# Patient Record
Sex: Female | Born: 1970 | Race: White | Hispanic: No | Marital: Single | Smoking: Never smoker
Health system: Southern US, Community
[De-identification: ages and names within clinical notes are randomized; demographics above are authoritative.]

## PROBLEM LIST (undated history)

## (undated) DIAGNOSIS — Z9889 Other specified postprocedural states: Secondary | ICD-10-CM

## (undated) DIAGNOSIS — R112 Nausea with vomiting, unspecified: Secondary | ICD-10-CM

## (undated) DIAGNOSIS — T8859XA Other complications of anesthesia, initial encounter: Secondary | ICD-10-CM

## (undated) DIAGNOSIS — F419 Anxiety disorder, unspecified: Secondary | ICD-10-CM

## (undated) DIAGNOSIS — T4145XA Adverse effect of unspecified anesthetic, initial encounter: Secondary | ICD-10-CM

## (undated) DIAGNOSIS — D259 Leiomyoma of uterus, unspecified: Secondary | ICD-10-CM

## (undated) HISTORY — PX: ABDOMINAL HYSTERECTOMY: SHX81

## (undated) HISTORY — PX: IUD REMOVAL: SHX5392

---
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
## 2005-08-17 ENCOUNTER — Observation Stay: Payer: Self-pay | Admitting: Unknown Physician Specialty

## 2005-08-18 ENCOUNTER — Observation Stay: Payer: Self-pay

## 2005-09-30 ENCOUNTER — Observation Stay: Payer: Self-pay | Admitting: Obstetrics & Gynecology

## 2005-10-29 ENCOUNTER — Inpatient Hospital Stay: Payer: Self-pay | Admitting: Unknown Physician Specialty

## 2006-02-13 ENCOUNTER — Emergency Department: Payer: Self-pay | Admitting: Emergency Medicine

## 2007-07-03 ENCOUNTER — Ambulatory Visit: Payer: Self-pay | Admitting: Obstetrics & Gynecology

## 2007-12-13 ENCOUNTER — Ambulatory Visit: Payer: Self-pay | Admitting: Obstetrics & Gynecology

## 2013-04-11 ENCOUNTER — Emergency Department: Payer: Self-pay | Admitting: Emergency Medicine

## 2013-04-11 LAB — CBC
HCT: 40.4 % (ref 35.0–47.0)
HGB: 13.8 g/dL (ref 12.0–16.0)
MCHC: 34.2 g/dL (ref 32.0–36.0)
RBC: 4.58 10*6/uL (ref 3.80–5.20)
RDW: 13.3 % (ref 11.5–14.5)

## 2013-04-11 LAB — BASIC METABOLIC PANEL
BUN: 12 mg/dL (ref 7–18)
Chloride: 109 mmol/L — ABNORMAL HIGH (ref 98–107)
Co2: 21 mmol/L (ref 21–32)
Creatinine: 0.85 mg/dL (ref 0.60–1.30)
Glucose: 91 mg/dL (ref 65–99)

## 2013-04-11 LAB — TROPONIN I: Troponin-I: <0.02 ng/mL

## 2016-05-12 ENCOUNTER — Emergency Department
Admission: EM | Admit: 2016-05-12 | Discharge: 2016-05-12 | Disposition: A | Discharge status: Home / Self Care | Payer: Managed Care, Other (non HMO) | Attending: Emergency Medicine | Admitting: Emergency Medicine

## 2016-05-12 ENCOUNTER — Emergency Department: Payer: Managed Care, Other (non HMO)

## 2016-05-12 DIAGNOSIS — Y999 Unspecified external cause status: Secondary | ICD-10-CM | POA: Diagnosis not present

## 2016-05-12 DIAGNOSIS — S39012A Strain of muscle, fascia and tendon of lower back, initial encounter: Secondary | ICD-10-CM | POA: Insufficient documentation

## 2016-05-12 DIAGNOSIS — Y92410 Unspecified street and highway as the place of occurrence of the external cause: Secondary | ICD-10-CM | POA: Diagnosis not present

## 2016-05-12 DIAGNOSIS — S6992XA Unspecified injury of left wrist, hand and finger(s), initial encounter: Secondary | ICD-10-CM | POA: Diagnosis present

## 2016-05-12 DIAGNOSIS — S39011A Strain of muscle, fascia and tendon of abdomen, initial encounter: Secondary | ICD-10-CM | POA: Insufficient documentation

## 2016-05-12 DIAGNOSIS — S62325A Displaced fracture of shaft of fourth metacarpal bone, left hand, initial encounter for closed fracture: Secondary | ICD-10-CM | POA: Insufficient documentation

## 2016-05-12 DIAGNOSIS — S20212A Contusion of left front wall of thorax, initial encounter: Secondary | ICD-10-CM | POA: Insufficient documentation

## 2016-05-12 DIAGNOSIS — S62309A Unspecified fracture of unspecified metacarpal bone, initial encounter for closed fracture: Secondary | ICD-10-CM

## 2016-05-12 DIAGNOSIS — Y9389 Activity, other specified: Secondary | ICD-10-CM | POA: Insufficient documentation

## 2016-05-12 LAB — URINALYSIS COMPLETE WITH MICROSCOPIC (ARMC ONLY)
BILIRUBIN URINE: NEGATIVE
GLUCOSE, UA: NEGATIVE mg/dL
KETONES UR: NEGATIVE mg/dL
LEUKOCYTES UA: NEGATIVE
NITRITE: NEGATIVE
Protein, ur: NEGATIVE mg/dL
SPECIFIC GRAVITY, URINE: 1.013 (ref 1.005–1.030)
pH: 5 (ref 5.0–8.0)

## 2016-05-12 MED ORDER — HYDROCODONE-ACETAMINOPHEN 5-325 MG PO TABS: 1.0000 | ORAL_TABLET | ORAL | Status: DC | PRN

## 2016-05-12 MED ORDER — BACLOFEN 10 MG PO TABS: 10.0000 mg | ORAL_TABLET | Freq: Three times a day (TID) | ORAL | Status: DC

## 2016-05-12 MED ORDER — IBUPROFEN 600 MG PO TABS: 600.0000 mg | ORAL_TABLET | Freq: Four times a day (QID) | ORAL | Status: DC | PRN
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
## 2016-12-23 ENCOUNTER — Other Ambulatory Visit: Payer: Self-pay | Admitting: Family Medicine

## 2016-12-23 DIAGNOSIS — Z1231 Encounter for screening mammogram for malignant neoplasm of breast: Secondary | ICD-10-CM

## 2017-01-04 ENCOUNTER — Ambulatory Visit
Admission: RE | Admit: 2017-01-04 | Discharge: 2017-01-04 | Disposition: A | Discharge status: Home / Self Care | Payer: Managed Care, Other (non HMO) | Source: Ambulatory Visit | Attending: Family Medicine | Admitting: Family Medicine

## 2017-01-04 DIAGNOSIS — N6489 Other specified disorders of breast: Secondary | ICD-10-CM | POA: Insufficient documentation

## 2017-01-04 DIAGNOSIS — Z1231 Encounter for screening mammogram for malignant neoplasm of breast: Secondary | ICD-10-CM

## 2017-01-10 ENCOUNTER — Other Ambulatory Visit: Payer: Self-pay | Admitting: *Deleted

## 2017-01-10 ENCOUNTER — Inpatient Hospital Stay
Admission: RE | Admit: 2017-01-10 | Discharge: 2017-01-10 | Disposition: A | Discharge status: Home / Self Care | Payer: Self-pay | Source: Ambulatory Visit | Attending: *Deleted | Admitting: *Deleted

## 2017-01-10 DIAGNOSIS — Z9289 Personal history of other medical treatment: Secondary | ICD-10-CM

## 2017-01-16 ENCOUNTER — Other Ambulatory Visit: Payer: Self-pay | Admitting: Family Medicine

## 2017-01-16 DIAGNOSIS — N6489 Other specified disorders of breast: Secondary | ICD-10-CM

## 2017-01-16 DIAGNOSIS — R928 Other abnormal and inconclusive findings on diagnostic imaging of breast: Secondary | ICD-10-CM

## 2017-01-27 ENCOUNTER — Other Ambulatory Visit: Payer: Managed Care, Other (non HMO)

## 2017-01-27 ENCOUNTER — Ambulatory Visit: Payer: Managed Care, Other (non HMO) | Attending: Family Medicine

## 2017-03-07 ENCOUNTER — Ambulatory Visit
Admission: RE | Admit: 2017-03-07 | Discharge: 2017-03-07 | Disposition: A | Discharge status: Home / Self Care | Payer: Self-pay | Source: Ambulatory Visit | Attending: Family Medicine | Admitting: Family Medicine

## 2017-03-07 DIAGNOSIS — R928 Other abnormal and inconclusive findings on diagnostic imaging of breast: Secondary | ICD-10-CM

## 2017-03-07 DIAGNOSIS — N6312 Unspecified lump in the right breast, upper inner quadrant: Secondary | ICD-10-CM | POA: Insufficient documentation

## 2017-03-07 DIAGNOSIS — N6489 Other specified disorders of breast: Secondary | ICD-10-CM

## 2017-03-09 ENCOUNTER — Other Ambulatory Visit: Payer: Self-pay | Admitting: Family Medicine

## 2017-03-09 DIAGNOSIS — R928 Other abnormal and inconclusive findings on diagnostic imaging of breast: Secondary | ICD-10-CM

## 2017-03-09 DIAGNOSIS — N6310 Unspecified lump in the right breast, unspecified quadrant: Secondary | ICD-10-CM

## 2017-03-17 ENCOUNTER — Ambulatory Visit
Admission: RE | Admit: 2017-03-17 | Discharge: 2017-03-17 | Disposition: A | Discharge status: Home / Self Care | Payer: Medicaid Other | Source: Ambulatory Visit | Attending: Family Medicine | Admitting: Family Medicine

## 2017-03-17 DIAGNOSIS — R928 Other abnormal and inconclusive findings on diagnostic imaging of breast: Secondary | ICD-10-CM

## 2017-03-17 DIAGNOSIS — N6312 Unspecified lump in the right breast, upper inner quadrant: Secondary | ICD-10-CM | POA: Insufficient documentation

## 2017-03-17 DIAGNOSIS — N6310 Unspecified lump in the right breast, unspecified quadrant: Secondary | ICD-10-CM

## 2017-03-17 HISTORY — PX: BREAST BIOPSY: SHX20

## 2017-03-20 LAB — SURGICAL PATHOLOGY

## 2017-12-27 ENCOUNTER — Other Ambulatory Visit: Payer: Self-pay | Admitting: Family Medicine

## 2017-12-27 DIAGNOSIS — Z1231 Encounter for screening mammogram for malignant neoplasm of breast: Secondary | ICD-10-CM

## 2018-01-12 ENCOUNTER — Ambulatory Visit
Admission: RE | Admit: 2018-01-12 | Discharge: 2018-01-12 | Disposition: A | Discharge status: Home / Self Care | Payer: Managed Care, Other (non HMO) | Source: Ambulatory Visit | Attending: Family Medicine | Admitting: Family Medicine

## 2018-01-12 DIAGNOSIS — Z1231 Encounter for screening mammogram for malignant neoplasm of breast: Secondary | ICD-10-CM | POA: Diagnosis not present

## 2019-01-18 ENCOUNTER — Other Ambulatory Visit: Payer: Self-pay

## 2019-01-18 ENCOUNTER — Encounter: Payer: Self-pay | Admitting: Emergency Medicine

## 2019-01-18 ENCOUNTER — Ambulatory Visit
Admission: EM | Admit: 2019-01-18 | Discharge: 2019-01-18 | Disposition: A | Discharge status: Home / Self Care | Payer: Managed Care, Other (non HMO) | Attending: Family Medicine | Admitting: Family Medicine

## 2019-01-18 DIAGNOSIS — J069 Acute upper respiratory infection, unspecified: Secondary | ICD-10-CM | POA: Diagnosis not present

## 2019-01-18 DIAGNOSIS — B9789 Other viral agents as the cause of diseases classified elsewhere: Secondary | ICD-10-CM

## 2019-01-18 LAB — RAPID INFLUENZA A&B ANTIGENS (ARMC ONLY): INFLUENZA A (ARMC): NEGATIVE

## 2019-01-18 LAB — RAPID INFLUENZA A&B ANTIGENS: Influenza B (ARMC): NEGATIVE

## 2019-01-18 LAB — RAPID STREP SCREEN (MED CTR MEBANE ONLY): Streptococcus, Group A Screen (Direct): NEGATIVE

## 2019-01-18 MED ORDER — HYDROCOD POLST-CPM POLST ER 10-8 MG/5ML PO SUER: 5.0000 mL | Freq: Two times a day (BID) | ORAL | 0 refills | Status: DC | PRN

## 2019-01-18 NOTE — ED Provider Notes (Signed)
[facility]    CSN: [id number] Arrival date & time: 01/18/19  1227     History   Chief Complaint Chief Complaint  Patient presents with  . Cough    HPI Amy Hubbard is a 48 y.o. female.   The history is provided by the patient.  Cough  Associated symptoms: ear pain, rhinorrhea and sore throat   Associated symptoms: no myalgias and no wheezing   URI  Presenting symptoms: congestion, cough, ear pain, rhinorrhea and sore throat   Severity:  Moderate Onset quality:  Sudden Duration:  2 days Timing:  Constant Progression:  Worsening Chronicity:  New Relieved by:  OTC medications Associated symptoms: no myalgias and no wheezing   Risk factors: sick contacts     History reviewed. No pertinent past medical history.  There are no active problems to display for this patient.   Past Surgical History:  Procedure Laterality Date  . BREAST BIOPSY Right 03/17/2017   fibroadenoma    OB History   No obstetric history on file.      Home Medications    Prior to Admission medications   Medication Sig Start Date End Date Taking? Authorizing Provider  ferrous sulfate 325 (65 FE) MG tablet Take 325 mg by mouth 2 (two) times daily with a meal.   Yes [provider]  FLUoxetine (PROZAC) 10 MG tablet Take by mouth. 12/03/18  Yes [provider]  ibuprofen (ADVIL,MOTRIN) 600 MG tablet Take 1 tablet (600 mg total) by mouth every 6 (six) hours as needed. 05/12/16  Yes Beers, Pierce Crane, PA-C  baclofen (LIORESAL) 10 MG tablet Take 1 tablet (10 mg total) by mouth 3 (three) times daily. 05/12/16   Beers, Pierce Crane, PA-C  chlorpheniramine-HYDROcodone (TUSSIONEX PENNKINETIC ER) 10-8 MG/5ML SUER Take 5 mLs by mouth every 12 (twelve) hours as needed. 01/18/19   Norval Gable, MD  HYDROcodone-acetaminophen (NORCO) 5-325 MG tablet Take 1-2 tablets by mouth every 4 (four) hours as needed for moderate pain. 05/12/16   Beers, Pierce Crane, PA-C    Family  History Family History  Problem Relation Age of Onset  . Breast cancer Neg Hx     Social History Social History   Tobacco Use  . Smoking status: Never Smoker  . Smokeless tobacco: Never Used  Substance Use Topics  . Alcohol use: No  . Drug use: Not on file     Allergies   Patient has no known allergies.   Review of Systems Review of Systems  HENT: Positive for congestion, ear pain, rhinorrhea and sore throat.   Respiratory: Positive for cough. Negative for wheezing.   Musculoskeletal: Negative for myalgias.     Physical Exam Triage Vital Signs ED Triage Vitals  Enc Vitals Group     BP 01/18/19 1253 128/79     Pulse Rate 01/18/19 1253 75     Resp 01/18/19 1253 16     Temp 01/18/19 1253 99 F (37.2 C)     Temp Source 01/18/19 1253 Oral     SpO2 01/18/19 1253 100 %     Weight 01/18/19 1245 184 lb (83.5 kg)     Height 01/18/19 1245 5\' 2"  (1.575 m)     Head Circumference --      Peak Flow --      Pain Score 01/18/19 1245 4     Pain Loc --      Pain Edu? --      Excl. in [place]? --  No data found.  Updated Vital Signs BP 128/79 (BP Location: Left Arm)   Pulse 75   Temp 99 F (37.2 C) (Oral)   Resp 16   Ht 5\' 2"  (1.575 m)   Wt 83.5 kg   LMP 01/01/2019 (Exact Date)   SpO2 100%   BMI 33.65 kg/m   Visual Acuity Right Eye Distance:   Left Eye Distance:   Bilateral Distance:    Right Eye Near:   Left Eye Near:    Bilateral Near:     Physical Exam Vitals signs and nursing note reviewed.  Constitutional:      General: She is not in acute distress.    Appearance: She is well-developed. She is not toxic-appearing or diaphoretic.  HENT:     Head: Normocephalic and atraumatic.     Right Ear: Tympanic membrane, ear canal and external ear normal.     Left Ear: Tympanic membrane, ear canal and external ear normal.     Nose: Congestion and rhinorrhea present.     Mouth/Throat:     Pharynx: Uvula midline. No oropharyngeal exudate.  Neck:      Musculoskeletal: Normal range of motion and neck supple.     Thyroid: No thyromegaly.  Cardiovascular:     Rate and Rhythm: Normal rate and regular rhythm.     Heart sounds: Normal heart sounds.  Pulmonary:     Effort: Pulmonary effort is normal. No respiratory distress.     Breath sounds: Normal breath sounds. No stridor. No wheezing, rhonchi or rales.  Lymphadenopathy:     Cervical: No cervical adenopathy.  Neurological:     Mental Status: She is alert.      UC Treatments / Results  Labs (all labs ordered are listed, but only abnormal results are displayed) Labs Reviewed  RAPID INFLUENZA A&B ANTIGENS ([facility] ONLY)  RAPID STREP SCREEN ([facility] ONLY)  CULTURE, GROUP A STREP [facility])    EKG None  Radiology No results found.  Procedures Procedures (including critical care time)  Medications Ordered in UC Medications - No data to display  Initial Impression / Assessment and Plan / UC Course  I have reviewed the triage vital signs and the nursing notes.  Pertinent labs & imaging results that were available during my care of the patient were reviewed by me and considered in my medical decision making (see chart for details).      Final Clinical Impressions(s) / UC Diagnoses   Final diagnoses:  Viral URI with cough    ED Prescriptions    Medication Sig Dispense Auth. Provider   chlorpheniramine-HYDROcodone (TUSSIONEX PENNKINETIC ER) 10-8 MG/5ML SUER Take 5 mLs by mouth every 12 (twelve) hours as needed. 60 mL Norval Gable, MD     1. diagnosis reviewed with patient 2. rx as per orders above; reviewed possible side effects, interactions, risks and benefits  3. Recommend supportive treatment with  4. Follow-up prn if symptoms worsen or don't improve Controlled Substance Prescriptions [place] Controlled Substance Registry consulted? Not Applicable   Norval Gable, MD 01/18/19 1352

## 2019-01-18 NOTE — ED Triage Notes (Signed)
Patient c/o cough, chest congestion bilateral ear pain and sore throat that started 2 days ago. Patient denies fevers.

## 2019-01-21 LAB — CULTURE, GROUP A STREP (THRC)

## 2019-01-30 ENCOUNTER — Inpatient Hospital Stay: Admission: RE | Admit: 2019-01-30 | Payer: Managed Care, Other (non HMO) | Source: Ambulatory Visit

## 2019-02-08 ENCOUNTER — Ambulatory Visit: Admit: 2019-02-08 | Payer: Managed Care, Other (non HMO) | Admitting: Obstetrics and Gynecology

## 2019-02-08 SURGERY — HYSTERECTOMY, VAGINAL, LAPAROSCOPY-ASSISTED, WITH SALPINGECTOMY
Anesthesia: General | Laterality: Bilateral

## 2019-04-19 ENCOUNTER — Ambulatory Visit: Admit: 2019-04-19 | Payer: Managed Care, Other (non HMO) | Admitting: Obstetrics and Gynecology

## 2019-04-19 SURGERY — HYSTERECTOMY, VAGINAL, LAPAROSCOPY-ASSISTED, WITH SALPINGECTOMY
Anesthesia: General | Laterality: Bilateral

## 2019-05-09 NOTE — H&P (Signed)
Amy Hubbard is a 48 y.o. female here for Rf Dr Santiago Glad Behling-menorrhagia .pt here for menorrhagia for > 1 yr . Bleeds for 5-6 days and passes clots . Menses are monthly . + dysmenorrhea , not sexually active . G2P2 svdx1 , c/s x1 . Not sexually active and + dyspareunia in past   Pap jan 2020- ASCUS r/o HGSIL  Colposcopic eval 12/2018 negative with neg ECC  EMBX : 1/ 2020 - neg   SIS : showed   Endometrial polyp seen with saline= 1.47 x 0.91 x 1.48 cm  Fibroid ut seen  1 lt lat= 17 mm 2 rt mid= 23 mm  Endometrium=14.92 mm  bil ovs wnl Past Medical History:  has a past medical history of Iron deficiency anemia due to chronic blood loss (10/17/2018), Migraines, and Vitamin D deficiency.  Past Surgical History:  has a past surgical history that includes esure and Cesarean section. Family History: family history includes Alcohol abuse in her mother; Anuerysm in her father; Bone cancer in her brother; Coronary Artery Disease (Blocked arteries around heart) in her paternal grandfather; Coronary Artery Disease (Blocked arteries around heart) (age of onset: 60) in her mother; Depression in her mother; High blood pressure (Hypertension) in her father; No Known Problems in her brother and daughter. Social History:  reports that she has never smoked. She has never used smokeless tobacco. She reports previous alcohol use. She reports that she does not use drugs. OB/GYN History:          OB History    Gravida  2   Para  2   Term      Preterm      AB      Living  2     SAB      TAB      Ectopic      Molar      Multiple      Live Births  2          Allergies: has No Known Allergies. Medications:  Current Outpatient Medications:  .  ibuprofen (MOTRIN) 400 MG tablet, Take 400 mg by mouth every 6 (six) hours as needed for Pain, Disp: , Rfl:  .  FLUoxetine (PROZAC) 10 MG tablet, Take 10 mg by mouth once daily, Disp: , Rfl:   Review of Systems: General:                       No fatigue or weight loss Eyes:                           No vision changes Ears:                            No hearing difficulty Respiratory:                No cough or shortness of breath Pulmonary:                  No asthma or shortness of breath Cardiovascular:           No chest pain, palpitations, dyspnea on exertion Gastrointestinal:          No abdominal bloating, chronic diarrhea, constipations, masses, pain or hematochezia Genitourinary:             No hematuria, dysuria, abnormal vaginal discharge, pelvic pain,+ Menometrorrhagia Lymphatic:  No swollen lymph nodes Musculoskeletal:         No muscle weakness Neurologic:                  No extremity weakness, syncope, seizure disorder Psychiatric:                  No history of depression, delusions or suicidal/homicidal ideation    Exam:      Vitals:   05/08/2019  0945  BP: 133/84  Pulse: 74    Body mass index is 34.2 kg/m.  WDWN white/  female in NAD   Lungs: CTA  CV : RRR without murmur   Neck:  no thyromegaly Abdomen: soft , no mass, normal active bowel sounds,  non-tender, no rebound tenderness Pelvic: tanner stage 5 ,  External genitalia: vulva /labia no lesions Urethra: no prolapse Vagina: normal physiologic d/c, limited room for TVH if need be  Cervix: no lesions, no cervical motion tenderness   Uterus: normal size shape and contour, non-tender Adnexa: no mass,  non-tender   Rectovaginal:   Impression:   The primary encounter diagnosis was Menorrhagia with irregular cycle. Pt elects for definitive surgery LAVH and bilateral salpingectomy    Plan:  Proceed with surgery as above   She has been counseled regarding the risks of the procedure . All questions have been answered

## 2019-05-24 ENCOUNTER — Encounter
Admission: RE | Admit: 2019-05-24 | Discharge: 2019-05-24 | Disposition: A | Discharge status: Home / Self Care | Payer: Managed Care, Other (non HMO) | Source: Ambulatory Visit | Attending: Obstetrics and Gynecology | Admitting: Obstetrics and Gynecology

## 2019-05-24 ENCOUNTER — Encounter: Payer: Self-pay | Admitting: *Deleted

## 2019-05-24 ENCOUNTER — Other Ambulatory Visit: Payer: Self-pay

## 2019-05-24 NOTE — Patient Instructions (Addendum)
Your procedure is scheduled on: 05/31/2019 Fri Report to Same Day Surgery 2nd floor medical mall [facility] Entrance-take elevator on left to 2nd floor.  Check in with surgery information desk.) To find out your arrival time please call [phone] between 1PM - 3PM on 05/30/2019 Thur  Remember: Instructions that are not followed completely may result in serious medical risk, up to and including death, or upon the discretion of your surgeon and anesthesiologist your surgery may need to be rescheduled.    _x___ 1. Do not eat food after midnight the night before your procedure. You may drink clear liquids up to 2 hours before you are scheduled to arrive at the hospital for your procedure.  Do not drink clear liquids within 2 hours of your scheduled arrival to the hospital.  Clear liquids include  --Water or Apple juice without pulp  --Clear carbohydrate beverage such as ClearFast or Gatorade  --Black Coffee or Clear Tea (No milk, no creamers, do not add anything to                  the coffee or Tea Type 1 and type 2 diabetics should only drink water.   ____Ensure clear carbohydrate drink on the way to the hospital for bariatric patients  ____Ensure clear carbohydrate drink 3 hours before surgery for Dr Dwyane Luo patients if physician instructed.   No gum chewing or hard candies.     __x__ 2. No Alcohol for 24 hours before or after surgery.   __x__3. No Smoking or e-cigarettes for 24 prior to surgery.  Do not use any chewable tobacco products for at least 6 hour prior to surgery   ____  4. Bring all medications with you on the day of surgery if instructed.    __x__ 5. Notify your doctor if there is any change in your medical condition     (cold, fever, infections).    x___6. On the morning of surgery brush your teeth with toothpaste and water.  You may rinse your mouth with mouth wash if you wish.  Do not swallow any toothpaste or mouthwash.   Do not wear jewelry, make-up,  hairpins, clips or nail polish.  Do not wear lotions, powders, or perfumes. You may wear deodorant.  Do not shave 48 hours prior to surgery. Men may shave face and neck.  Do not bring valuables to the hospital.    [facility] is not responsible for any belongings or valuables.               Contacts, dentures or bridgework may not be worn into surgery.  Leave your suitcase in the car. After surgery it may be brought to your room.  For patients admitted to the hospital, discharge time is determined by your                       treatment team.  _  Patients discharged the day of surgery will not be allowed to drive home.  You will need someone to drive you home and stay with you the night of your procedure.    Please read over the following fact sheets that you were given:   [facility] Preparing for Surgery and or MRSA Information   _x___ Take anti-hypertensive listed below, cardiac, seizure, asthma,     anti-reflux and psychiatric medicines. These include:  1. none  2.  3.  4.  5.  6.  ____Fleets enema or Magnesium Citrate as directed.  _x___ Use CHG Soap or sage wipes as directed on instruction sheet   ____ Use inhalers on the day of surgery and bring to hospital day of surgery  ____ Stop Metformin and Janumet 2 days prior to surgery.    ____ Take 1/2 of usual insulin dose the night before surgery and none on the morning     surgery.   _x___ Follow recommendations from Cardiologist, Pulmonologist or PCP regarding          stopping Aspirin, Coumadin, Plavix ,Eliquis, Effient, or Pradaxa, and Pletal.  X____Stop Anti-inflammatories such as Advil, Aleve, Ibuprofen, Motrin, Naproxen, Naprosyn, Goodies powders or aspirin products. OK to take Tylenol and                          Celebrex.   _x___ Stop supplements until after surgery.  But may continue Vitamin D, Vitamin B,       and multivitamin.   ____ Bring C-Pap to the hospital.

## 2019-05-24 NOTE — Pre-Procedure Instructions (Signed)
Incentive spirometry and carb drink to be given day of Covid testing along with instructions.

## 2019-05-28 ENCOUNTER — Other Ambulatory Visit
Admission: RE | Admit: 2019-05-28 | Discharge: 2019-05-28 | Disposition: A | Discharge status: Home / Self Care | Payer: Managed Care, Other (non HMO) | Source: Ambulatory Visit | Attending: Obstetrics and Gynecology | Admitting: Obstetrics and Gynecology

## 2019-05-28 ENCOUNTER — Other Ambulatory Visit: Payer: Self-pay

## 2019-05-28 DIAGNOSIS — Z01812 Encounter for preprocedural laboratory examination: Secondary | ICD-10-CM | POA: Diagnosis not present

## 2019-05-28 DIAGNOSIS — Z1159 Encounter for screening for other viral diseases: Secondary | ICD-10-CM | POA: Insufficient documentation

## 2019-05-28 DIAGNOSIS — N920 Excessive and frequent menstruation with regular cycle: Secondary | ICD-10-CM | POA: Insufficient documentation

## 2019-05-28 LAB — CBC
HCT: 40 % (ref 36.0–46.0)
Hemoglobin: 12.7 g/dL (ref 12.0–15.0)
MCH: 28.4 pg (ref 26.0–34.0)
MCHC: 31.8 g/dL (ref 30.0–36.0)
MCV: 89.5 fL (ref 80.0–100.0)
Platelets: 342 10*3/uL (ref 150–400)
RBC: 4.47 MIL/uL (ref 3.87–5.11)
RDW: 12.9 % (ref 11.5–15.5)
WBC: 8 10*3/uL (ref 4.0–10.5)
nRBC: 0 % (ref 0.0–0.2)

## 2019-05-28 LAB — TYPE AND SCREEN
ABO/RH(D): O NEG
Antibody Screen: NEGATIVE

## 2019-05-28 LAB — BASIC METABOLIC PANEL
Anion gap: 10 (ref 5–15)
BUN: 16 mg/dL (ref 6–20)
CO2: 22 mmol/L (ref 22–32)
Calcium: 9 mg/dL (ref 8.9–10.3)
Chloride: 105 mmol/L (ref 98–111)
Creatinine, Ser: 0.7 mg/dL (ref 0.44–1.00)
GFR calc Af Amer: >60 mL/min (ref >60)
GFR calc non Af Amer: >60 mL/min (ref >60)
Glucose, Bld: 90 mg/dL (ref 70–99)
Potassium: 4.2 mmol/L (ref 3.5–5.1)
Sodium: 137 mmol/L (ref 135–145)

## 2019-05-28 LAB — SARS CORONAVIRUS 2 (TAT 6-24 HRS): SARS Coronavirus 2: NEGATIVE

## 2019-05-31 ENCOUNTER — Observation Stay: Payer: Managed Care, Other (non HMO) | Admitting: Anesthesiology

## 2019-05-31 ENCOUNTER — Observation Stay
Admission: RE | Admit: 2019-05-31 | Discharge: 2019-06-01 | Disposition: A | Discharge status: Home / Self Care | Payer: Managed Care, Other (non HMO) | Attending: Obstetrics and Gynecology | Admitting: Obstetrics and Gynecology

## 2019-05-31 ENCOUNTER — Encounter: Payer: Self-pay | Admitting: *Deleted

## 2019-05-31 ENCOUNTER — Encounter
Admission: RE | Disposition: A | Discharge status: Home / Self Care | Payer: Self-pay | Source: Home / Self Care | Attending: Obstetrics and Gynecology

## 2019-05-31 ENCOUNTER — Other Ambulatory Visit: Payer: Self-pay

## 2019-05-31 DIAGNOSIS — Z6833 Body mass index (BMI) 33.0-33.9, adult: Secondary | ICD-10-CM | POA: Insufficient documentation

## 2019-05-31 DIAGNOSIS — N841 Polyp of cervix uteri: Secondary | ICD-10-CM | POA: Insufficient documentation

## 2019-05-31 DIAGNOSIS — Z791 Long term (current) use of non-steroidal anti-inflammatories (NSAID): Secondary | ICD-10-CM | POA: Insufficient documentation

## 2019-05-31 DIAGNOSIS — N920 Excessive and frequent menstruation with regular cycle: Secondary | ICD-10-CM | POA: Diagnosis not present

## 2019-05-31 DIAGNOSIS — Z811 Family history of alcohol abuse and dependence: Secondary | ICD-10-CM | POA: Insufficient documentation

## 2019-05-31 DIAGNOSIS — N840 Polyp of corpus uteri: Secondary | ICD-10-CM | POA: Insufficient documentation

## 2019-05-31 DIAGNOSIS — N941 Unspecified dyspareunia: Secondary | ICD-10-CM | POA: Diagnosis not present

## 2019-05-31 DIAGNOSIS — E559 Vitamin D deficiency, unspecified: Secondary | ICD-10-CM | POA: Insufficient documentation

## 2019-05-31 DIAGNOSIS — D509 Iron deficiency anemia, unspecified: Secondary | ICD-10-CM | POA: Insufficient documentation

## 2019-05-31 DIAGNOSIS — Z808 Family history of malignant neoplasm of other organs or systems: Secondary | ICD-10-CM | POA: Diagnosis not present

## 2019-05-31 DIAGNOSIS — N838 Other noninflammatory disorders of ovary, fallopian tube and broad ligament: Secondary | ICD-10-CM | POA: Diagnosis not present

## 2019-05-31 DIAGNOSIS — Z818 Family history of other mental and behavioral disorders: Secondary | ICD-10-CM | POA: Diagnosis not present

## 2019-05-31 DIAGNOSIS — Z79899 Other long term (current) drug therapy: Secondary | ICD-10-CM | POA: Insufficient documentation

## 2019-05-31 DIAGNOSIS — D251 Intramural leiomyoma of uterus: Secondary | ICD-10-CM | POA: Diagnosis not present

## 2019-05-31 DIAGNOSIS — D250 Submucous leiomyoma of uterus: Secondary | ICD-10-CM | POA: Insufficient documentation

## 2019-05-31 DIAGNOSIS — Z8249 Family history of ischemic heart disease and other diseases of the circulatory system: Secondary | ICD-10-CM | POA: Diagnosis not present

## 2019-05-31 DIAGNOSIS — N800 Endometriosis of uterus: Secondary | ICD-10-CM | POA: Diagnosis not present

## 2019-05-31 DIAGNOSIS — Z9889 Other specified postprocedural states: Secondary | ICD-10-CM

## 2019-05-31 DIAGNOSIS — E669 Obesity, unspecified: Secondary | ICD-10-CM | POA: Diagnosis not present

## 2019-05-31 HISTORY — DX: Leiomyoma of uterus, unspecified: D25.9

## 2019-05-31 HISTORY — DX: Nausea with vomiting, unspecified: Z98.890

## 2019-05-31 HISTORY — DX: Other complications of anesthesia, initial encounter: T88.59XA

## 2019-05-31 HISTORY — DX: Anxiety disorder, unspecified: F41.9

## 2019-05-31 HISTORY — PX: LAPAROSCOPIC VAGINAL HYSTERECTOMY WITH SALPINGECTOMY: SHX6680

## 2019-05-31 LAB — POCT PREGNANCY, URINE: Preg Test, Ur: NEGATIVE

## 2019-05-31 LAB — ABO/RH: ABO/RH(D): O NEG

## 2019-05-31 SURGERY — HYSTERECTOMY, VAGINAL, LAPAROSCOPY-ASSISTED, WITH SALPINGECTOMY
Anesthesia: General | Laterality: Bilateral

## 2019-05-31 MED ORDER — BUPIVACAINE HCL (PF) 0.5 % IJ SOLN
INTRAMUSCULAR | Status: AC
  Filled 2019-05-31: qty 30

## 2019-05-31 MED ORDER — ACETAMINOPHEN 500 MG PO TABS
1000.0000 mg | ORAL_TABLET | ORAL | Status: AC
Start: 2019-05-31 — End: 2019-05-31
  Administered 2019-05-31: 07:00:00 1000 mg via ORAL

## 2019-05-31 MED ORDER — SUGAMMADEX SODIUM 200 MG/2ML IV SOLN
INTRAVENOUS | Status: DC | PRN
  Administered 2019-05-31: 200 mg via INTRAVENOUS

## 2019-05-31 MED ORDER — LIDOCAINE-EPINEPHRINE 1 %-1:100000 IJ SOLN
INTRAMUSCULAR | Status: DC | PRN
  Administered 2019-05-31: 9 mL

## 2019-05-31 MED ORDER — PROPOFOL 500 MG/50ML IV EMUL
INTRAVENOUS | Status: DC | PRN
  Administered 2019-05-31: 150 ug/kg/min via INTRAVENOUS

## 2019-05-31 MED ORDER — PROPOFOL 500 MG/50ML IV EMUL
INTRAVENOUS | Status: AC
  Filled 2019-05-31: qty 50

## 2019-05-31 MED ORDER — KETOROLAC TROMETHAMINE 30 MG/ML IJ SOLN
INTRAMUSCULAR | Status: DC | PRN
  Administered 2019-05-31: 30 mg via INTRAVENOUS

## 2019-05-31 MED ORDER — FENTANYL CITRATE (PF) 100 MCG/2ML IJ SOLN
INTRAMUSCULAR | Status: AC
  Filled 2019-05-31: qty 2

## 2019-05-31 MED ORDER — SCOPOLAMINE 1 MG/3DAYS TD PT72
1.0000 | MEDICATED_PATCH | TRANSDERMAL | Status: DC
  Administered 2019-05-31: 07:00:00 1.5 mg via TRANSDERMAL

## 2019-05-31 MED ORDER — LIDOCAINE-EPINEPHRINE 1 %-1:100000 IJ SOLN
INTRAMUSCULAR | Status: AC
  Filled 2019-05-31: qty 1

## 2019-05-31 MED ORDER — FENTANYL CITRATE (PF) 100 MCG/2ML IJ SOLN
25.0000 ug | INTRAMUSCULAR | Status: DC | PRN
  Administered 2019-05-31 (×2): 50 ug via INTRAVENOUS
  Administered 2019-05-31: 11:00:00 25 ug via INTRAVENOUS

## 2019-05-31 MED ORDER — LACTATED RINGERS IV SOLN
INTRAVENOUS | Status: DC | PRN
  Administered 2019-05-31: 08:00:00 via INTRAVENOUS

## 2019-05-31 MED ORDER — ROCURONIUM BROMIDE 50 MG/5ML IV SOLN
INTRAVENOUS | Status: AC
  Filled 2019-05-31: qty 1

## 2019-05-31 MED ORDER — GABAPENTIN 300 MG PO CAPS
300.0000 mg | ORAL_CAPSULE | Freq: Every day | ORAL | Status: DC
  Administered 2019-05-31: 23:00:00 300 mg via ORAL
  Filled 2019-05-31: qty 1

## 2019-05-31 MED ORDER — FLUOXETINE HCL 10 MG PO CAPS
10.0000 mg | ORAL_CAPSULE | Freq: Every day | ORAL | Status: DC
  Administered 2019-05-31: 10 mg via ORAL
  Filled 2019-05-31: qty 1

## 2019-05-31 MED ORDER — ACETAMINOPHEN 500 MG PO TABS
1000.0000 mg | ORAL_TABLET | Freq: Four times a day (QID) | ORAL | Status: DC
  Administered 2019-05-31 – 2019-06-01 (×4): 1000 mg via ORAL
  Filled 2019-05-31 (×4): qty 2

## 2019-05-31 MED ORDER — DEXAMETHASONE SODIUM PHOSPHATE 10 MG/ML IJ SOLN
INTRAMUSCULAR | Status: DC | PRN
  Administered 2019-05-31: 10 mg via INTRAVENOUS

## 2019-05-31 MED ORDER — GABAPENTIN 300 MG PO CAPS
ORAL_CAPSULE | ORAL | Status: AC
  Administered 2019-05-31: 300 mg via ORAL
  Filled 2019-05-31: qty 1

## 2019-05-31 MED ORDER — FENTANYL CITRATE (PF) 250 MCG/5ML IJ SOLN
INTRAMUSCULAR | Status: AC
  Filled 2019-05-31: qty 5

## 2019-05-31 MED ORDER — SUCCINYLCHOLINE CHLORIDE 20 MG/ML IJ SOLN
INTRAMUSCULAR | Status: AC
  Filled 2019-05-31: qty 1

## 2019-05-31 MED ORDER — ONDANSETRON HCL 4 MG/2ML IJ SOLN
INTRAMUSCULAR | Status: AC
  Filled 2019-05-31: qty 2

## 2019-05-31 MED ORDER — ONDANSETRON HCL 4 MG/2ML IJ SOLN
4.0000 mg | Freq: Four times a day (QID) | INTRAMUSCULAR | Status: DC | PRN
  Administered 2019-05-31: 18:00:00 4 mg via INTRAVENOUS
  Filled 2019-05-31: qty 2

## 2019-05-31 MED ORDER — OXYCODONE HCL 5 MG PO TABS: 5.0000 mg | ORAL_TABLET | Freq: Once | ORAL | Status: DC | PRN

## 2019-05-31 MED ORDER — LACTATED RINGERS IV SOLN
INTRAVENOUS | Status: DC
  Administered 2019-05-31 (×3): via INTRAVENOUS

## 2019-05-31 MED ORDER — MIDAZOLAM HCL 2 MG/2ML IJ SOLN
INTRAMUSCULAR | Status: DC | PRN
  Administered 2019-05-31: 2 mg via INTRAVENOUS

## 2019-05-31 MED ORDER — LACTATED RINGERS IV SOLN: INTRAVENOUS | Status: DC

## 2019-05-31 MED ORDER — LACTATED RINGERS IV BOLUS
500.0000 mL | Freq: Once | INTRAVENOUS | Status: AC
  Administered 2019-05-31: 19:00:00 500 mL via INTRAVENOUS

## 2019-05-31 MED ORDER — ONDANSETRON HCL 4 MG PO TABS: 4.0000 mg | ORAL_TABLET | Freq: Four times a day (QID) | ORAL | Status: DC | PRN

## 2019-05-31 MED ORDER — OXYCODONE HCL 5 MG/5ML PO SOLN: 5.0000 mg | Freq: Once | ORAL | Status: DC | PRN

## 2019-05-31 MED ORDER — MEPERIDINE HCL 50 MG/ML IJ SOLN
6.2500 mg | INTRAMUSCULAR | Status: DC | PRN
  Administered 2019-05-31: 12.5 mg via INTRAVENOUS

## 2019-05-31 MED ORDER — PHENYLEPHRINE HCL (PRESSORS) 10 MG/ML IV SOLN
INTRAVENOUS | Status: AC
  Filled 2019-05-31: qty 1

## 2019-05-31 MED ORDER — PROPOFOL 10 MG/ML IV BOLUS
INTRAVENOUS | Status: DC | PRN
  Administered 2019-05-31: 160 mg via INTRAVENOUS

## 2019-05-31 MED ORDER — MEPERIDINE HCL 50 MG/ML IJ SOLN
INTRAMUSCULAR | Status: AC
  Filled 2019-05-31: qty 1

## 2019-05-31 MED ORDER — ROCURONIUM BROMIDE 100 MG/10ML IV SOLN
INTRAVENOUS | Status: DC | PRN
  Administered 2019-05-31: 10 mg via INTRAVENOUS
  Administered 2019-05-31: 50 mg via INTRAVENOUS

## 2019-05-31 MED ORDER — PROMETHAZINE HCL 25 MG/ML IJ SOLN: 6.2500 mg | INTRAMUSCULAR | Status: DC | PRN

## 2019-05-31 MED ORDER — GABAPENTIN 300 MG PO CAPS
300.0000 mg | ORAL_CAPSULE | ORAL | Status: AC
  Administered 2019-05-31: 07:00:00 300 mg via ORAL

## 2019-05-31 MED ORDER — PROPOFOL 10 MG/ML IV BOLUS
INTRAVENOUS | Status: AC
  Filled 2019-05-31: qty 20

## 2019-05-31 MED ORDER — SEVOFLURANE IN SOLN
RESPIRATORY_TRACT | Status: AC
  Filled 2019-05-31: qty 250

## 2019-05-31 MED ORDER — OXYCODONE HCL 5 MG PO TABS
5.0000 mg | ORAL_TABLET | ORAL | Status: DC | PRN
  Administered 2019-05-31: 10 mg via ORAL
  Filled 2019-05-31: qty 2

## 2019-05-31 MED ORDER — LIDOCAINE HCL (CARDIAC) PF 100 MG/5ML IV SOSY
PREFILLED_SYRINGE | INTRAVENOUS | Status: DC | PRN
  Administered 2019-05-31: 100 mg via INTRAVENOUS

## 2019-05-31 MED ORDER — FAMOTIDINE 20 MG PO TABS
ORAL_TABLET | ORAL | Status: AC
  Filled 2019-05-31: qty 1

## 2019-05-31 MED ORDER — MORPHINE SULFATE (PF) 2 MG/ML IV SOLN: 1.0000 mg | INTRAVENOUS | Status: DC | PRN

## 2019-05-31 MED ORDER — BUPIVACAINE HCL 0.5 % IJ SOLN
INTRAMUSCULAR | Status: DC | PRN
  Administered 2019-05-31: 17 mL

## 2019-05-31 MED ORDER — CEFAZOLIN SODIUM-DEXTROSE 2-4 GM/100ML-% IV SOLN
INTRAVENOUS | Status: AC
  Filled 2019-05-31: qty 100

## 2019-05-31 MED ORDER — FENTANYL CITRATE (PF) 100 MCG/2ML IJ SOLN
INTRAMUSCULAR | Status: DC | PRN
  Administered 2019-05-31: 50 ug via INTRAVENOUS

## 2019-05-31 MED ORDER — ONDANSETRON HCL 4 MG/2ML IJ SOLN
INTRAMUSCULAR | Status: DC | PRN
  Administered 2019-05-31: 4 mg via INTRAVENOUS

## 2019-05-31 MED ORDER — CEFAZOLIN SODIUM-DEXTROSE 2-4 GM/100ML-% IV SOLN: 2.0000 g | Freq: Once | INTRAVENOUS | Status: DC

## 2019-05-31 MED ORDER — DEXMEDETOMIDINE HCL IN NACL 80 MCG/20ML IV SOLN
INTRAVENOUS | Status: AC
  Filled 2019-05-31: qty 20

## 2019-05-31 MED ORDER — SCOPOLAMINE 1 MG/3DAYS TD PT72
MEDICATED_PATCH | TRANSDERMAL | Status: AC
  Administered 2019-05-31: 07:00:00 1.5 mg via TRANSDERMAL
  Filled 2019-05-31: qty 1

## 2019-05-31 MED ORDER — SOD CITRATE-CITRIC ACID 500-334 MG/5ML PO SOLN
30.0000 mL | ORAL | Status: AC
  Administered 2019-05-31: 07:00:00 30 mL via ORAL
  Filled 2019-05-31 (×2): qty 30

## 2019-05-31 MED ORDER — ACETAMINOPHEN 500 MG PO TABS
ORAL_TABLET | ORAL | Status: AC
  Administered 2019-05-31: 07:00:00 1000 mg via ORAL
  Filled 2019-05-31: qty 2

## 2019-05-31 MED ORDER — MIDAZOLAM HCL 2 MG/2ML IJ SOLN
INTRAMUSCULAR | Status: AC
  Filled 2019-05-31: qty 2

## 2019-05-31 SURGICAL SUPPLY — 49 items
BAG URINE DRAINAGE (UROLOGICAL SUPPLIES) ×2 IMPLANT
BLADE SURG SZ11 CARB STEEL (BLADE) ×2 IMPLANT
CATH FOLEY 2WAY  5CC 16FR (CATHETERS) ×1
CATH ROBINSON RED A/P 16FR (CATHETERS) ×2 IMPLANT
CATH URTH 16FR FL 2W BLN LF (CATHETERS) ×1 IMPLANT
CHLORAPREP W/TINT 26 (MISCELLANEOUS) ×2 IMPLANT
COVER WAND RF STERILE (DRAPES) ×2 IMPLANT
DERMABOND ADVANCED (GAUZE/BANDAGES/DRESSINGS) ×1
DERMABOND ADVANCED .7 DNX12 (GAUZE/BANDAGES/DRESSINGS) ×1 IMPLANT
DRAPE SURG 17X11 SM STRL (DRAPES) ×2 IMPLANT
DRSG TEGADERM 2-3/8X2-3/4 SM (GAUZE/BANDAGES/DRESSINGS) ×8 IMPLANT
ELECT REM PT RETURN 9FT ADLT (ELECTROSURGICAL) ×2
ELECTRODE REM PT RTRN 9FT ADLT (ELECTROSURGICAL) ×1 IMPLANT
FILTER LAP SMOKE EVAC STRL (MISCELLANEOUS) ×2 IMPLANT
GAUZE 4X4 16PLY RFD (DISPOSABLE) ×2 IMPLANT
GLOVE BIO SURGEON STRL SZ8 (GLOVE) ×14 IMPLANT
GOWN STRL REUS W/ TWL LRG LVL3 (GOWN DISPOSABLE) ×3 IMPLANT
GOWN STRL REUS W/ TWL XL LVL3 (GOWN DISPOSABLE) ×3 IMPLANT
GOWN STRL REUS W/TWL LRG LVL3 (GOWN DISPOSABLE) ×3
GOWN STRL REUS W/TWL XL LVL3 (GOWN DISPOSABLE) ×3
GRASPER SUT TROCAR 14GX15 (MISCELLANEOUS) ×2 IMPLANT
HANDLE YANKAUER SUCT BULB TIP (MISCELLANEOUS) ×2 IMPLANT
IRRIGATION STRYKERFLOW (MISCELLANEOUS) ×1 IMPLANT
IRRIGATOR STRYKERFLOW (MISCELLANEOUS) ×2
IV LACTATED RINGERS 1000ML (IV SOLUTION) ×2 IMPLANT
KIT PINK PAD W/HEAD ARE REST (MISCELLANEOUS) ×2
KIT PINK PAD W/HEAD ARM REST (MISCELLANEOUS) ×1 IMPLANT
KIT TURNOVER CYSTO (KITS) ×2 IMPLANT
LABEL OR SOLS (LABEL) ×2 IMPLANT
NEEDLE HYPO 22GX1.5 SAFETY (NEEDLE) ×2 IMPLANT
PACK BASIN MINOR ARMC (MISCELLANEOUS) ×2 IMPLANT
PACK GYN LAPAROSCOPIC (MISCELLANEOUS) ×2 IMPLANT
PAD OB MATERNITY 4.3X12.25 (PERSONAL CARE ITEMS) ×2 IMPLANT
SCISSORS METZENBAUM CVD 33 (INSTRUMENTS) ×1 IMPLANT
SHEARS HARMONIC ACE PLUS 36CM (ENDOMECHANICALS) ×2 IMPLANT
SLEEVE ENDOPATH XCEL 5M (ENDOMECHANICALS) ×4 IMPLANT
SPONGE GAUZE 2X2 8PLY STRL LF (GAUZE/BANDAGES/DRESSINGS) ×6 IMPLANT
STRIP CLOSURE SKIN 1/2X4 (GAUZE/BANDAGES/DRESSINGS) ×2 IMPLANT
SUT VIC AB 0 CT1 27 (SUTURE) ×4
SUT VIC AB 0 CT1 27XCR 8 STRN (SUTURE) ×2 IMPLANT
SUT VIC AB 0 CT1 36 (SUTURE) ×3 IMPLANT
SUT VIC AB 0 CT2 27 (SUTURE) ×3 IMPLANT
SUT VIC AB 2-0 UR6 27 (SUTURE) ×2 IMPLANT
SUT VIC AB 4-0 SH 27 (SUTURE) ×1
SUT VIC AB 4-0 SH 27XANBCTRL (SUTURE) ×1 IMPLANT
SYR 10ML LL (SYRINGE) ×2 IMPLANT
SYR CONTROL 10ML (SYRINGE) ×2 IMPLANT
TROCAR XCEL NON-BLD 5MMX100MML (ENDOMECHANICALS) ×2 IMPLANT
TUBING EVAC SMOKE HEATED PNEUM (TUBING) ×2 IMPLANT

## 2019-05-31 NOTE — Anesthesia Procedure Notes (Signed)
Procedure Name: Intubation Date/Time: 05/31/2019 7:45 AM Performed by: Justus Memory, CRNA Pre-anesthesia Checklist: Patient identified, Patient being monitored, Timeout performed, Emergency Drugs available and Suction available Patient Re-evaluated:Patient Re-evaluated prior to induction Oxygen Delivery Method: Circle system utilized Preoxygenation: Pre-oxygenation with 100% oxygen Induction Type: IV induction Ventilation: Mask ventilation without difficulty Laryngoscope Size: Mac and 3 Grade View: Grade I Tube type: Oral Tube size: 7.0 mm Number of attempts: 1 Airway Equipment and Method: Stylet and Bite block Placement Confirmation: ETT inserted through vocal cords under direct vision,  positive ETCO2 and breath sounds checked- equal and bilateral Secured at: 21 cm Tube secured with: Tape Dental Injury: Teeth and Oropharynx as per pre-operative assessment

## 2019-05-31 NOTE — Transfer of Care (Signed)
Immediate Anesthesia Transfer of Care Note  Patient: Amy Hubbard  Procedure(s) Performed: LAPAROSCOPIC ASSISTED VAGINAL HYSTERECTOMY WITH SALPINGECTOMY (Bilateral )  Patient Location: PACU  Anesthesia Type:General  Level of Consciousness: sedated  Airway & Oxygen Therapy: Patient Spontanous Breathing and Patient connected to face mask oxygen  Post-op Assessment: Report given to RN and Post -op Vital signs reviewed and stable  Post vital signs: Reviewed and stable  Last Vitals:  Vitals Value Taken Time  BP 110/58 05/31/19 1039  Temp    Pulse 66 05/31/19 1043  Resp 17 05/31/19 1043  SpO2 100 % 05/31/19 1043  Vitals shown include unvalidated device data.  Last Pain:  Vitals:   05/31/19 1030  TempSrc:   PainSc: (P) 0-No pain         Complications: No apparent anesthesia complications

## 2019-05-31 NOTE — Progress Notes (Signed)
LAVH and bilateral salpingectomy .  No questions  Labs reviewed. Covid neg  proceed

## 2019-05-31 NOTE — Op Note (Signed)
NAME: Amy Hubbard, Amy Hubbard MEDICAL RECORD [id number] ACCOUNT [id number] DATE OF BIRTH:[birth year] FACILITY: [facility] LOCATION: [facility] PHYSICIAN:Oksana Deberry Josefine Class, MD  OPERATIVE REPORT  DATE OF PROCEDURE:  05/31/2019  PREOPERATIVE DIAGNOSES: 1.  Menorrhagia. 2.  Endometrial polyp. 3.  Dyspareunia.  POSTOPERATIVE DIAGNOSES: 1.  Menorrhagia. 2.  Endometrial polyp.  3.  Dyspareunia.  PROCEDURE: 1.  Laparoscopic assisted vaginal hysterectomy. 2.  Bilateral salpingectomy.  ANESTHESIA:  General endotracheal anesthesia.  SURGEON:  Laverta Baltimore, MD.  FIRST ASSISTANT:  Chelsea Ward, MD  INDICATIONS:  This is a 48 year old gravida 2, para 2 patient with a long history of menorrhagia and iron deficiency anemia.  The patient underwent a workup that showed endometrial polyp.  Based on the patient's desire to rectify the menorrhagia and  dyspareunia, she opted for definitive surgery, i.e., hysterectomy.  DESCRIPTION OF PROCEDURE:  After adequate general endotracheal anesthesia, the patient was placed in dorsal supine position with the legs in the [place] stirrups.  The patient's abdomen, perineum and vagina were prepped and draped in normal sterile  fashion.  Timeout was performed.  The patient did receive 2 grams IV Ancef prior to commencement of the case.  Attention was directed vaginally where a speculum was placed in the vagina and the anterior cervix was grasped with a single tooth tenaculum  and uterine sound was placed into the endometrial cavity and tethered together with the single tooth tenaculum.  This was used for uterine manipulation during the procedure.  Straight catheterization of the bladder yielded 50 mL clear urine.  Gloves were  changed.  Attention was directed to the patient's abdomen where a 5 mm infraumbilical incision was made after injecting with 0.5% Marcaine.  A 5 mm laparoscope was advanced into the abdominal cavity under direct visualization with the  Optiview cannula.   Second port placement was placed in the left lower quadrant 3 cm medial to the left anterior iliac spine and a third port site was placed in the right lower quadrant, again 3 cm medial to the right anterior iliac spine.  Initial impression revealed a  benign appearing left ovarian cyst.  There were some adhesions from the colon to the left cul-de-sac.  Attention was directed to the patient's left fallopian tube, which was grasped at the end and the Harmonic scalpel was used to dissect the fallopian  tube from the mesosalpinx and all the way to the cornua.  The left round ligament was opened followed by clamping and transecting the left ovarian uterine ligament.  The left uterine artery was skeletonized and cauterized with the Kleppinger and  transected with the Harmonic scalpel.  The anterior bladder flap was created with Harmonic scalpel.  Similar procedure was repeated on the patient's right side.  Again, the fallopian tube was dissected free from the ovary with the Harmonic scalpel and  the uteroovarian ligament was transected.  Broad ligament was dissected and the right uterine artery was skeletonized, cauterized and transected.  There were some adhesions noted of the bladder to the anterior cervix given her prior history of cesarean  section.  Attention was then directed vaginally and a weighted speculum was placed in the posterior vaginal vault.  The cervix was grasped with 2 thyroid tenacula followed by circumferential injection with 1% lidocaine with 1:100,000 epinephrine.  A  direct posterior colpotomy incision was made.  Upon entry into the posterior cul-de-sac, a long billed weighted speculum was placed.  The uterosacral ligaments were bilaterally clamped, transected, suture ligated with 0 Vicryl suture and tagged  for later  identification.  Anterior cervix was circumferentially incised with the Bovie, and the anterior cul-de-sac was entered and the bladder was reflected  anteriorly.  The cardinal ligaments were bilaterally clamped, transected, suture ligated with 0 Vicryl  suture and 2 additional clamps were used to connect the dissection from the laparoscopic approach.  The uterus was delivered.  The vaginal cuff was then closed with a running 0 Vicryl suture with good approximation of edges.  One additional  figure-of-eight suture was required at the apex due to some bleeding.  Gloves were changed.  Attention was redirected to the abdominal cavity and the patient's abdomen was insufflated, the cuff was irrigated and good hemostasis was noted.  There was  normal peristaltic activity from the ureters prior to dissection and after dissection.  Pressure was lowered to 7 mmHg and good hemostasis was noted.  Upper abdomen appeared normal.  There was a small adhesion from the colon to the left pelvic sidewall,  which was taken down with Harmonic scalpel.  The patient's abdomen was deflated and the incision sites were closed with 4-0 Vicryl suture.  Sterile dressing applied.  COMPLICATIONS:  There were no complications.  ESTIMATED BLOOD LOSS:  100 mL  URINE OUTPUT:  50 mL and an additional 50 mL with Foley placement at the end of the case.  INTRAOPERATIVE FLUIDS:  1000 mL  DISPOSITION:  The patient tolerated the procedure well and was taken to recovery room in good condition.  TN/NUANCE  D:05/31/2019 T:05/31/2019 JOB:[id number]

## 2019-05-31 NOTE — Brief Op Note (Signed)
05/31/2019  10:17 AM  PATIENT:  Amy Hubbard  48 y.o. female  PRE-OPERATIVE DIAGNOSIS:  Menorrhagia, dyspareunia , endometrial polyp  POST-OPERATIVE DIAGNOSIS:  Menorrhagia, dyspareunia  Endometrial polyp   PROCEDURE:  Procedure(s): LAPAROSCOPIC ASSISTED VAGINAL HYSTERECTOMY WITH SALPINGECTOMY (Bilateral)  SURGEON:  Surgeon(s) and Role:    * Schermerhorn, Gwen Her, MD - Primary    * Ward, Honor Loh, MD - Assisting  PHYSICIAN ASSISTANT:   ASSISTANTS: none   ANESTHESIA:   none  EBL:  100 mL   BLOOD ADMINISTERED:none  DRAINS: Urinary Catheter (Foley)   LOCAL MEDICATIONS USED:  MARCAINE    and LIDOCAINE   SPECIMEN:  Source of Specimen:  cx uterus and bilateral tubes   DISPOSITION OF SPECIMEN:  PATHOLOGY  COUNTS:  YES  TOURNIQUET:  * No tourniquets in log *  DICTATION: .Other Dictation: Dictation Number verbal  PLAN OF CARE: Admit for overnight observation  PATIENT DISPOSITION:  PACU - hemodynamically stable.   Delay start of Pharmacological VTE agent (>24hrs) due to surgical blood loss or risk of bleeding: not applicable

## 2019-05-31 NOTE — Anesthesia Post-op Follow-up Note (Signed)
Anesthesia QCDR form completed.        

## 2019-05-31 NOTE — Anesthesia Preprocedure Evaluation (Signed)
Anesthesia Evaluation  Patient identified by MRN, date of birth, ID band Patient awake    Reviewed: Allergy & Precautions, NPO status , Patient's Chart, lab work & pertinent test results  History of Anesthesia Complications (+) PONV and history of anesthetic complications  Airway Mallampati: II  TM Distance: >3 FB Neck ROM: Full    Dental no notable dental hx.    Pulmonary neg pulmonary ROS, neg sleep apnea, neg COPD,    breath sounds clear to auscultation- rhonchi (-) wheezing      Cardiovascular Exercise Tolerance: Good (-) hypertension(-) CAD, (-) Past MI, (-) Cardiac Stents and (-) CABG  Rhythm:Regular Rate:Normal - Systolic murmurs and - Diastolic murmurs    Neuro/Psych neg Seizures Anxiety negative neurological ROS     GI/Hepatic negative GI ROS, Neg liver ROS,   Endo/Other  negative endocrine ROSneg diabetes  Renal/GU negative Renal ROS     Musculoskeletal negative musculoskeletal ROS (+)   Abdominal (+) + obese,   Peds  Hematology negative hematology ROS (+)   Anesthesia Other Findings Past Medical History: No date: Anxiety No date: Complication of anesthesia No date: PONV (postoperative nausea and vomiting) No date: Uterine fibroid   Reproductive/Obstetrics                             Anesthesia Physical Anesthesia Plan  ASA: II  Anesthesia Plan: General   Post-op Pain Management:    Induction: Intravenous  PONV Risk Score and Plan: 3 and Ondansetron, Dexamethasone, TIVA, Scopolamine patch - Pre-op and Midazolam  Airway Management Planned: Oral ETT  Additional Equipment:   Intra-op Plan:   Post-operative Plan: Extubation in OR  Informed Consent: I have reviewed the patients History and Physical, chart, labs and discussed the procedure including the risks, benefits and alternatives for the proposed anesthesia with the patient or authorized representative who has  indicated his/her understanding and acceptance.     Dental advisory given  Plan Discussed with: CRNA and Anesthesiologist  Anesthesia Plan Comments:         Anesthesia Quick Evaluation

## 2019-05-31 NOTE — Progress Notes (Signed)
.   Patient ID: Amy Hubbard, female   DOB: 1971/11/13, 48 y.o.   MRN: [id number] DOS , LAVH bilateral salpingectomy  sore . Some nausea . Orine output 30-35 cc/ hr  VSS  Abd: soft appropriate TTP  A: stable  P; IVF bolus 500 cc D/c foley after  IVF at 100 cc/hr

## 2019-05-31 NOTE — Anesthesia Postprocedure Evaluation (Signed)
Anesthesia Post Note  Patient: Amy Hubbard  Procedure(s) Performed: LAPAROSCOPIC ASSISTED VAGINAL HYSTERECTOMY WITH SALPINGECTOMY (Bilateral )  Patient location during evaluation: PACU Anesthesia Type: General Level of consciousness: awake and alert and oriented Pain management: pain level controlled Vital Signs Assessment: post-procedure vital signs reviewed and stable Respiratory status: spontaneous breathing, nonlabored ventilation and respiratory function stable Cardiovascular status: blood pressure returned to baseline and stable Postop Assessment: no signs of nausea or vomiting Anesthetic complications: no     Last Vitals:  Vitals:   05/31/19 1315 05/31/19 1405  BP: 127/64 109/75  Pulse: 66 76  Resp: 16 16  Temp: 36.4 C 37 C  SpO2: 100% 98%    Last Pain:  Vitals:   05/31/19 1406  TempSrc:   PainSc: 4                  Shambhavi Salley

## 2019-06-01 DIAGNOSIS — N920 Excessive and frequent menstruation with regular cycle: Secondary | ICD-10-CM | POA: Diagnosis not present

## 2019-06-01 LAB — CBC
HCT: 32 % — ABNORMAL LOW (ref 36.0–46.0)
Hemoglobin: 10.2 g/dL — ABNORMAL LOW (ref 12.0–15.0)
MCH: 28.4 pg (ref 26.0–34.0)
MCHC: 31.9 g/dL (ref 30.0–36.0)
MCV: 89.1 fL (ref 80.0–100.0)
Platelets: 275 10*3/uL (ref 150–400)
RBC: 3.59 MIL/uL — ABNORMAL LOW (ref 3.87–5.11)
RDW: 12.9 % (ref 11.5–15.5)
WBC: 10.5 10*3/uL (ref 4.0–10.5)
nRBC: 0 % (ref 0.0–0.2)

## 2019-06-01 LAB — BASIC METABOLIC PANEL
Anion gap: 6 (ref 5–15)
BUN: 11 mg/dL (ref 6–20)
CO2: 24 mmol/L (ref 22–32)
Calcium: 8.3 mg/dL — ABNORMAL LOW (ref 8.9–10.3)
Chloride: 108 mmol/L (ref 98–111)
Creatinine, Ser: 0.68 mg/dL (ref 0.44–1.00)
GFR calc Af Amer: >60 mL/min (ref >60)
GFR calc non Af Amer: >60 mL/min (ref >60)
Glucose, Bld: 94 mg/dL (ref 70–99)
Potassium: 3.8 mmol/L (ref 3.5–5.1)
Sodium: 138 mmol/L (ref 135–145)

## 2019-06-01 MED ORDER — IBUPROFEN 800 MG PO TABS: 800.0000 mg | ORAL_TABLET | Freq: Three times a day (TID) | ORAL | 0 refills | Status: DC | PRN

## 2019-06-01 MED ORDER — GABAPENTIN 300 MG PO CAPS: 300.0000 mg | ORAL_CAPSULE | Freq: Every day | ORAL | 0 refills | Status: DC

## 2019-06-01 MED ORDER — ONDANSETRON HCL 4 MG PO TABS: 4.0000 mg | ORAL_TABLET | Freq: Four times a day (QID) | ORAL | 0 refills | Status: DC | PRN

## 2019-06-01 MED ORDER — OXYCODONE-ACETAMINOPHEN 5-325 MG PO TABS: 1.0000 | ORAL_TABLET | Freq: Four times a day (QID) | ORAL | 0 refills | Status: DC | PRN

## 2019-06-01 NOTE — Discharge Summary (Signed)
Physician Discharge Summary  Patient ID: Amy Hubbard MRN: [id number] DOB/AGE: 1971-07-11 48 y.o.  Admit date: 05/31/2019 Discharge date: 06/01/2019  Admission Diagnoses:menorrhagia  Discharge Diagnoses:  Active Problems:   Postoperative state   Discharged Condition: good  Hospital Course: underwent an uncomplicated LAVH and bila salpingectomy . Good urine output and urinating without difficulty , POST op day 1 labs normal. pain in good control   Consults: None  Significant Diagnostic Studies: labs:  Results for orders placed or performed during the hospital encounter of 05/31/19 (from the past 24 hour(s))  CBC     Status: Abnormal   Collection Time: 06/01/19  5:48 AM  Result Value Ref Range   WBC 10.5 4.0 - 10.5 K/uL   RBC 3.59 (L) 3.87 - 5.11 MIL/uL   Hemoglobin 10.2 (L) 12.0 - 15.0 g/dL   HCT 32.0 (L) 36.0 - 46.0 %   MCV 89.1 80.0 - 100.0 fL   MCH 28.4 26.0 - 34.0 pg   MCHC 31.9 30.0 - 36.0 g/dL   RDW 12.9 11.5 - 15.5 %   Platelets 275 150 - 400 K/uL   nRBC 0.0 0.0 - 0.2 %  Basic metabolic panel     Status: Abnormal   Collection Time: 06/01/19  5:48 AM  Result Value Ref Range   Sodium 138 135 - 145 mmol/L   Potassium 3.8 3.5 - 5.1 mmol/L   Chloride 108 98 - 111 mmol/L   CO2 24 22 - 32 mmol/L   Glucose, Bld 94 70 - 99 mg/dL   BUN 11 6 - 20 mg/dL   Creatinine, Ser 0.68 0.44 - 1.00 mg/dL   Calcium 8.3 (L) 8.9 - 10.3 mg/dL   GFR calc non Af Amer >60 >60 mL/min   GFR calc Af Amer >60 >60 mL/min   Anion gap 6 5 - 15    Treatments: surgery: as above  Discharge Exam: Blood pressure 99/65, pulse (!) 55, temperature 98 F (36.7 C), temperature source Oral, resp. rate 16, height 5\' 2"  (1.575 m), weight 84 kg, last menstrual period 05/15/2019, SpO2 94 %. General appearance: alert and cooperative Resp: clear to auscultation bilaterally Breasts: normal appearance, no masses or tenderness GI: soft, non-tender; bowel sounds normal; no masses,  no  organomegaly  Disposition: Discharge disposition: 01-Home or Self Care       Discharge Instructions    Call MD for:  difficulty breathing, headache or visual disturbances   Complete by: As directed    Call MD for:  extreme fatigue   Complete by: As directed    Call MD for:  hives   Complete by: As directed    Call MD for:  persistant dizziness or light-headedness   Complete by: As directed    Call MD for:  persistant nausea and vomiting   Complete by: As directed    Call MD for:  redness, tenderness, or signs of infection (pain, swelling, redness, odor or green/yellow discharge around incision site)   Complete by: As directed    Call MD for:  severe uncontrolled pain   Complete by: As directed    Call MD for:  temperature >100.4   Complete by: As directed    Diet - low sodium heart healthy   Complete by: As directed    Increase activity slowly   Complete by: As directed      Allergies as of 06/01/2019      Reactions   Sumatriptan    Dizziness  Medication List    STOP taking these medications   chlorpheniramine-HYDROcodone 10-8 MG/5ML Suer Commonly known as: Tussionex Pennkinetic ER   Iron 27 240 (27 FE) MG tablet Generic drug: ferrous gluconate   Magnesium 400 MG Caps   vitamin C 500 MG tablet Commonly known as: ASCORBIC ACID     TAKE these medications   diclofenac sodium 1 % Gel Commonly known as: VOLTAREN Apply 2 g topically 2 (two) times daily as needed (back pain).   FLUoxetine 10 MG tablet Commonly known as: PROZAC Take 10 mg by mouth every evening.   gabapentin 300 MG capsule Commonly known as: NEURONTIN Take 1 capsule (300 mg total) by mouth at bedtime.   ibuprofen 800 MG tablet Commonly known as: ADVIL Take 1 tablet (800 mg total) by mouth every 8 (eight) hours as needed. What changed:   medication strength  how much to take  when to take this  reasons to take this   ondansetron 4 MG tablet Commonly known as: ZOFRAN Take 1  tablet (4 mg total) by mouth every 6 (six) hours as needed for nausea.   oxyCODONE-acetaminophen 5-325 MG tablet Commonly known as: Percocet Take 1-2 tablets by mouth every 6 (six) hours as needed for severe pain.      Follow-up Information    Demiah Gullickson, Gwen Her, MD Follow up in 2 week(s).   Specialty: Obstetrics and Gynecology Why: video visit  Contact information: [address] [place] [address] [phone]           Signed: Gwen Her Charlissa Petros 06/01/2019, 7:19 AM

## 2019-06-01 NOTE — Discharge Instructions (Signed)
Call your doctor for increased pain or vaginal bleeding, temperature above 100.4, dizziness or vision changes, severe headache, inability to tolerate food or fluids, depression, or concerns.  Keep incisions clean and dry.  Call your doctor for incision concerns including redness, swelling, bleeding, or if begins to come part.  No strenuous activity or heavy lifting for 6 weeks.  No intercourse, tampons, or douching for 6 weeks.  No tub baths- showers only.  No driving for 2 weeks or while taking pain medications.

## 2019-06-01 NOTE — Progress Notes (Signed)
Discharge instructions provided.  Pt verbalizes understanding of all instructions and follow-up care.  Pt discharged to home at 0930 on 06/01/19 via wheelchair by NT. Reed Breech, RN 06/01/2019 10:15 AM

## 2019-06-03 LAB — SURGICAL PATHOLOGY

## 2019-08-12 ENCOUNTER — Other Ambulatory Visit: Payer: Self-pay | Admitting: Pediatrics

## 2019-08-12 DIAGNOSIS — Z1231 Encounter for screening mammogram for malignant neoplasm of breast: Secondary | ICD-10-CM

## 2019-08-26 ENCOUNTER — Other Ambulatory Visit: Payer: Self-pay

## 2019-08-26 ENCOUNTER — Encounter (INDEPENDENT_AMBULATORY_CARE_PROVIDER_SITE_OTHER): Payer: Self-pay

## 2019-08-26 ENCOUNTER — Ambulatory Visit
Admission: RE | Admit: 2019-08-26 | Discharge: 2019-08-26 | Disposition: A | Discharge status: Home / Self Care | Payer: Managed Care, Other (non HMO) | Source: Ambulatory Visit | Attending: Pediatrics | Admitting: Pediatrics

## 2019-08-26 DIAGNOSIS — Z1231 Encounter for screening mammogram for malignant neoplasm of breast: Secondary | ICD-10-CM | POA: Insufficient documentation

## 2019-12-21 ENCOUNTER — Other Ambulatory Visit: Payer: Self-pay

## 2019-12-21 ENCOUNTER — Emergency Department: Payer: Self-pay

## 2019-12-21 ENCOUNTER — Emergency Department
Admission: EM | Admit: 2019-12-21 | Discharge: 2019-12-21 | Disposition: A | Discharge status: Home / Self Care | Payer: Self-pay | Attending: Emergency Medicine | Admitting: Emergency Medicine

## 2019-12-21 DIAGNOSIS — S46011S Strain of muscle(s) and tendon(s) of the rotator cuff of right shoulder, sequela: Secondary | ICD-10-CM | POA: Insufficient documentation

## 2019-12-21 DIAGNOSIS — S46011A Strain of muscle(s) and tendon(s) of the rotator cuff of right shoulder, initial encounter: Secondary | ICD-10-CM

## 2019-12-21 DIAGNOSIS — X500XXS Overexertion from strenuous movement or load, sequela: Secondary | ICD-10-CM | POA: Insufficient documentation

## 2019-12-21 DIAGNOSIS — M47816 Spondylosis without myelopathy or radiculopathy, lumbar region: Secondary | ICD-10-CM | POA: Insufficient documentation

## 2019-12-21 DIAGNOSIS — Z79899 Other long term (current) drug therapy: Secondary | ICD-10-CM | POA: Insufficient documentation

## 2019-12-21 MED ORDER — BACLOFEN 10 MG PO TABS: 10.0000 mg | ORAL_TABLET | Freq: Every day | ORAL | 1 refills | Status: AC

## 2019-12-21 MED ORDER — PREDNISONE 10 MG (21) PO TBPK: ORAL_TABLET | ORAL | 0 refills | Status: DC
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
## 2019-12-21 NOTE — ED Provider Notes (Signed)
[facility] Emergency Department Provider Note  ____________________________________________   First MD Initiated Contact with Patient 12/21/19 1107     (approximate)  I have reviewed the triage vital signs and the nursing notes.   HISTORY  Chief Complaint Back Pain    HPI Amy Hubbard is a 49 y.o. female presents emergency department complaining of right shoulder pain for a long time.  Low back pain for 4 years.  States the pain has been getting worse.  She was told she had a questionable rotator cuff tear in the right shoulder when she had her Worker's Comp. injury while working at [employer].  She denies any numbness or tingling.  States that she has been taking her baclofen without relief.  No fever or chills.  No new injuries.    Past Medical History:  Diagnosis Date  . Anxiety   . Complication of anesthesia   . PONV (postoperative nausea and vomiting)   . Uterine fibroid     Patient Active Problem List   Diagnosis Date Noted  . Postoperative state 05/31/2019    Past Surgical History:  Procedure Laterality Date  . ABDOMINAL HYSTERECTOMY    . BREAST BIOPSY Right 03/17/2017   fibroadenoma, bx/clip  . IUD REMOVAL    . LAPAROSCOPIC VAGINAL HYSTERECTOMY WITH SALPINGECTOMY Bilateral 05/31/2019   Procedure: LAPAROSCOPIC ASSISTED VAGINAL HYSTERECTOMY WITH SALPINGECTOMY;  Surgeon: Schermerhorn, Gwen Her, MD;  Location: [facility] ORS;  Service: Gynecology;  Laterality: Bilateral;    Prior to Admission medications   Medication Sig Start Date End Date Taking? Authorizing Provider  baclofen (LIORESAL) 10 MG tablet Take 1 tablet (10 mg total) by mouth daily. 12/21/19 12/20/20  Versie Starks, PA-C  diclofenac sodium (VOLTAREN) 1 % GEL Apply 2 g topically 2 (two) times daily as needed (back pain).    [provider]  FLUoxetine (PROZAC) 10 MG tablet Take 10 mg by mouth every evening.  12/03/18   [provider]  gabapentin (NEURONTIN) 300  MG capsule Take 1 capsule (300 mg total) by mouth at bedtime. 06/01/19   Schermerhorn, Gwen Her, MD  predniSONE (STERAPRED UNI-PAK 21 TAB) 10 MG (21) TBPK tablet Take 6 pills on day one then decrease by 1 pill each day 12/21/19   Versie Starks, PA-C    Allergies Sumatriptan  Family History  Problem Relation Age of Onset  . Breast cancer Neg Hx     Social History Social History   Tobacco Use  . Smoking status: Never Smoker  . Smokeless tobacco: Never Used  Substance Use Topics  . Alcohol use: Yes    Alcohol/week: 2.0 standard drinks    Types: 2 Glasses of wine per week  . Drug use: Never    Review of Systems  Constitutional: No fever/chills Eyes: No visual changes. ENT: No sore throat. Respiratory: Denies cough Cardiovascular: Denies chest pain Gastrointestinal: Denies abdominal pain Genitourinary: Negative for dysuria. Musculoskeletal: Positive for back pain and right shoulder pain Skin: Negative for rash. Psychiatric: no mood changes,     ____________________________________________   PHYSICAL EXAM:  VITAL SIGNS: ED Triage Vitals  Enc Vitals Group     BP 12/21/19 1119 116/70     Pulse Rate 12/21/19 1119 84     Resp --      Temp 12/21/19 1119 98.6 F (37 C)     Temp Source 12/21/19 1119 Oral     SpO2 12/21/19 1119 98 %     Weight 12/21/19 1112 184 lb (83.5  kg)     Height 12/21/19 1112 5\' 2"  (1.575 m)     Head Circumference --      Peak Flow --      Pain Score 12/21/19 1111 10     Pain Loc --      Pain Edu? --      Excl. in [place]? --     Constitutional: Alert and oriented. Well appearing and in no acute distress. Eyes: Conjunctivae are normal.  Head: Atraumatic. Nose: No congestion/rhinnorhea. Neck:  supple no lymphadenopathy noted Cardiovascular: Normal rate, regular rhythm. Heart sounds are normal Respiratory: Normal respiratory effort.  No retractions, lungs c t a  GU: deferred Musculoskeletal: Positive for lower back tenderness, positive for  right shoulder tenderness at the ACJ, decreased range of motion of the right shoulder, patient cannot internally rotate, positive Hawks sign, neurovascular intact  neurologic:  Normal speech and language.  Skin:  Skin is warm, dry and intact. No rash noted. Psychiatric: Mood and affect are normal. Speech and behavior are normal.  ____________________________________________   LABS (all labs ordered are listed, but only abnormal results are displayed)  Labs Reviewed - No data to display ____________________________________________   ____________________________________________  RADIOLOGY  X-ray of the right shoulder is negative X-ray of the lumbar spine shows facet degeneration  ____________________________________________   PROCEDURES  Procedure(s) performed: No  Procedures    ____________________________________________   INITIAL IMPRESSION / ASSESSMENT AND PLAN / ED COURSE  Pertinent labs & imaging results that were available during my care of the patient were reviewed by me and considered in my medical decision making (see chart for details).   Patient's 49 year old female presents emergency department with chronic pain of the right shoulder and lower spine.  See HPI  Physical exam shows patient to appear well.  Vitals are.  Right shoulder is tender with decreased range of motion, lumbar spine is mildly tender.  X-ray of the right shoulder is negative, xray lumbar spine is facet degeneration  Discussed findings with the patient, encouraged her to f/u with dr hooten for her shoulder, he can then refer her to someone that can inject the facets.  She was given a rx for sterapred and baclofen.  Return if worsening    Amy Hubbard was evaluated in Emergency Department on 12/21/2019 for the symptoms described in the history of present illness. She was evaluated in the context of the global COVID-19 pandemic, which necessitated consideration that the patient might be  at risk for infection with the SARS-CoV-2 virus that causes COVID-19. Institutional protocols and algorithms that pertain to the evaluation of patients at risk for COVID-19 are in a state of rapid change based on information released by regulatory bodies including the CDC and federal and state organizations. These policies and algorithms were followed during the patient's care in the ED.   As part of my medical decision making, I reviewed the following data within the [facility] notes reviewed and incorporated, Old chart reviewed, Radiograph reviewed , Notes from prior ED visits and [place] Controlled Substance Database  ____________________________________________   FINAL CLINICAL IMPRESSION(S) / ED DIAGNOSES  Final diagnoses:  Rotator cuff strain, right, initial encounter  Facet degeneration of lumbar region      NEW MEDICATIONS STARTED DURING THIS VISIT:  Discharge Medication List as of 12/21/2019 12:31 PM    START taking these medications   Details  baclofen (LIORESAL) 10 MG tablet Take 1 tablet (10 mg total) by mouth daily., Starting Sat  12/21/2019, Until Sun 12/20/2020, Normal    predniSONE (STERAPRED UNI-PAK 21 TAB) 10 MG (21) TBPK tablet Take 6 pills on day one then decrease by 1 pill each day, Normal         Note:  This document was prepared using Dragon voice recognition software and may include unintentional dictation errors.    Versie Starks, PA-C 12/21/19 1247    Arta Silence, MD 12/21/19 1455

## 2019-12-21 NOTE — ED Notes (Addendum)
See triage note. Pt ambulatory to room. Pt A&Ox4. VSS. Pt breathing regular, even and unlabored. Pt denies any needs at this time.

## 2019-12-21 NOTE — Discharge Instructions (Addendum)
Follow-up with Dr. Marry Guan.  Please call for an appointment.  Use the steroid and muscle relaxer as prescribed.  Return if worsening.

## 2020-04-09 ENCOUNTER — Other Ambulatory Visit: Payer: Self-pay | Admitting: Orthopedic Surgery

## 2020-04-09 DIAGNOSIS — M545 Low back pain, unspecified: Secondary | ICD-10-CM

## 2020-04-09 DIAGNOSIS — M5136 Other intervertebral disc degeneration, lumbar region: Secondary | ICD-10-CM

## 2020-04-09 DIAGNOSIS — M5442 Lumbago with sciatica, left side: Secondary | ICD-10-CM

## 2020-04-25 ENCOUNTER — Other Ambulatory Visit: Payer: Self-pay

## 2020-04-25 ENCOUNTER — Ambulatory Visit
Admission: RE | Admit: 2020-04-25 | Discharge: 2020-04-25 | Disposition: A | Discharge status: Home / Self Care | Payer: Medicaid Other | Source: Ambulatory Visit | Attending: Orthopedic Surgery | Admitting: Orthopedic Surgery

## 2020-04-25 DIAGNOSIS — M545 Low back pain, unspecified: Secondary | ICD-10-CM

## 2020-04-25 DIAGNOSIS — M5136 Other intervertebral disc degeneration, lumbar region: Secondary | ICD-10-CM | POA: Diagnosis present

## 2020-04-25 DIAGNOSIS — M5442 Lumbago with sciatica, left side: Secondary | ICD-10-CM | POA: Diagnosis present

## 2020-04-25 DIAGNOSIS — G8929 Other chronic pain: Secondary | ICD-10-CM | POA: Insufficient documentation

## 2020-07-09 ENCOUNTER — Other Ambulatory Visit: Payer: Self-pay | Admitting: Neurology

## 2020-07-09 DIAGNOSIS — R431 Parosmia: Secondary | ICD-10-CM

## 2020-07-29 ENCOUNTER — Other Ambulatory Visit: Payer: Self-pay | Admitting: Pediatrics

## 2020-07-29 DIAGNOSIS — Z1231 Encounter for screening mammogram for malignant neoplasm of breast: Secondary | ICD-10-CM

## 2020-08-01 ENCOUNTER — Other Ambulatory Visit: Payer: Self-pay

## 2020-08-01 ENCOUNTER — Ambulatory Visit
Admission: RE | Admit: 2020-08-01 | Discharge: 2020-08-01 | Disposition: A | Discharge status: Home / Self Care | Payer: Medicaid Other | Source: Ambulatory Visit | Attending: Neurology | Admitting: Neurology

## 2020-08-01 DIAGNOSIS — R431 Parosmia: Secondary | ICD-10-CM | POA: Diagnosis not present

## 2020-08-01 MED ORDER — GADOBUTROL 1 MMOL/ML IV SOLN
7.5000 mL | Freq: Once | INTRAVENOUS | Status: AC | PRN
  Administered 2020-08-01: 7.5 mL via INTRAVENOUS

## 2020-08-31 ENCOUNTER — Other Ambulatory Visit: Payer: Self-pay

## 2020-08-31 ENCOUNTER — Ambulatory Visit
Admission: RE | Admit: 2020-08-31 | Discharge: 2020-08-31 | Disposition: A | Discharge status: Home / Self Care | Payer: Medicaid Other | Source: Ambulatory Visit | Attending: Pediatrics | Admitting: Pediatrics

## 2020-08-31 DIAGNOSIS — Z1231 Encounter for screening mammogram for malignant neoplasm of breast: Secondary | ICD-10-CM | POA: Diagnosis present

## 2020-10-15 ENCOUNTER — Other Ambulatory Visit: Payer: Self-pay

## 2020-10-15 ENCOUNTER — Emergency Department: Payer: Medicaid Other

## 2020-10-15 DIAGNOSIS — X509XXA Other and unspecified overexertion or strenuous movements or postures, initial encounter: Secondary | ICD-10-CM | POA: Insufficient documentation

## 2020-10-15 DIAGNOSIS — S3992XA Unspecified injury of lower back, initial encounter: Secondary | ICD-10-CM | POA: Diagnosis present

## 2020-10-15 DIAGNOSIS — S39012A Strain of muscle, fascia and tendon of lower back, initial encounter: Secondary | ICD-10-CM | POA: Insufficient documentation

## 2020-10-15 NOTE — ED Triage Notes (Signed)
Pt comes via POV from home with c/o back pain. Pt states she gets injections for her back pain the patient states today she was carrying a christmas container and felt something pop.  Pt states she took some pain medication with little relief. Pt states she feels numb and tight in her back. Pt also states she had some loss of bladder following this incident.

## 2020-10-16 ENCOUNTER — Emergency Department
Admission: EM | Admit: 2020-10-16 | Discharge: 2020-10-16 | Disposition: A | Discharge status: Home / Self Care | Payer: Medicaid Other | Attending: Emergency Medicine | Admitting: Emergency Medicine

## 2020-10-16 DIAGNOSIS — S39012A Strain of muscle, fascia and tendon of lower back, initial encounter: Secondary | ICD-10-CM

## 2020-10-16 LAB — URINALYSIS, COMPLETE (UACMP) WITH MICROSCOPIC
Bilirubin Urine: NEGATIVE
Glucose, UA: NEGATIVE mg/dL
Ketones, ur: 5 mg/dL — AB
Leukocytes,Ua: NEGATIVE
Nitrite: NEGATIVE
Protein, ur: NEGATIVE mg/dL
Specific Gravity, Urine: 1.013 (ref 1.005–1.030)
WBC, UA: NONE SEEN WBC/hpf (ref 0–5)
pH: 5 (ref 5.0–8.0)

## 2020-10-16 NOTE — ED Provider Notes (Signed)
[facility] Emergency Department Provider Note ____________________________________________   First MD Initiated Contact with Patient 10/16/20 [phone]     (approximate)  I have reviewed the triage vital signs and the nursing notes.  HISTORY  Chief Complaint Back Pain   HPI Amy Hubbard is a 49 y.o. femalewho presents to the ED for evaluation of acute on chronic back pain.  Chart review indicates history of obesity, anxiety and chronic back pain.  Patient reports being in her typical state of health until she was carrying some Christmas boxes into the house from the garage when she felt a popping sensation to her lower back and immediate pain.  She reports this is similar to her typical pain, but worse intensity.  Up to 9/10 intensity R > L lumbar back pain that is sharp shooting, radiating down her bilateral buttocks into her lower extremities.  She reports having an episode of urinary incontinence that occurred shortly after this incident that she reports is very concerning to her.  She denies any urinary retention, dysuria, abdominal pain, emesis, fever.   Past Medical History:  Diagnosis Date  . Anxiety   . Complication of anesthesia   . PONV (postoperative nausea and vomiting)   . Uterine fibroid     Patient Active Problem List   Diagnosis Date Noted  . Postoperative state 05/31/2019    Past Surgical History:  Procedure Laterality Date  . ABDOMINAL HYSTERECTOMY    . BREAST BIOPSY Right 03/17/2017   fibroadenoma, bx/clip  . IUD REMOVAL    . LAPAROSCOPIC VAGINAL HYSTERECTOMY WITH SALPINGECTOMY Bilateral 05/31/2019   Procedure: LAPAROSCOPIC ASSISTED VAGINAL HYSTERECTOMY WITH SALPINGECTOMY;  Surgeon: Schermerhorn, Gwen Her, MD;  Location: [facility] ORS;  Service: Gynecology;  Laterality: Bilateral;    Prior to Admission medications   Medication Sig Start Date End Date Taking? Authorizing Provider  baclofen (LIORESAL) 10 MG tablet Take 1 tablet  (10 mg total) by mouth daily. 12/21/19 12/20/20  Versie Starks, PA-C  diclofenac sodium (VOLTAREN) 1 % GEL Apply 2 g topically 2 (two) times daily as needed (back pain).    [provider]  FLUoxetine (PROZAC) 10 MG tablet Take 10 mg by mouth every evening.  12/03/18   [provider]  gabapentin (NEURONTIN) 300 MG capsule Take 1 capsule (300 mg total) by mouth at bedtime. 06/01/19   Schermerhorn, Gwen Her, MD  predniSONE (STERAPRED UNI-PAK 21 TAB) 10 MG (21) TBPK tablet Take 6 pills on day one then decrease by 1 pill each day 12/21/19   Versie Starks, PA-C    Allergies Sumatriptan  Family History  Problem Relation Age of Onset  . Breast cancer Neg Hx     Social History Social History   Tobacco Use  . Smoking status: Never Smoker  . Smokeless tobacco: Never Used  Vaping Use  . Vaping Use: Never used  Substance Use Topics  . Alcohol use: Yes    Alcohol/week: 2.0 standard drinks    Types: 2 Glasses of wine per week  . Drug use: Never    Review of Systems  Constitutional: No fever/chills Eyes: No visual changes. ENT: No sore throat. Cardiovascular: Denies chest pain. Respiratory: Denies shortness of breath. Gastrointestinal: No abdominal pain.  No nausea, no vomiting.  No diarrhea.  No constipation.  Positive for single episode of urinary incontinence Genitourinary: Negative for dysuria. Musculoskeletal: Positive for back pain. Skin: Negative for rash. Neurological: Negative for headaches, focal weakness or numbness.   ____________________________________________  PHYSICAL EXAM:  VITAL SIGNS: Vitals:   10/15/20 1819 10/16/20 0103  BP: 129/84 (!) 129/94  Pulse: (!) 102 84  Resp: 18 17  Temp: 98.8 F (37.1 C)   SpO2: 100% 99%      Constitutional: Alert and oriented. Well appearing and in no acute distress.  Obese.  Sitting up on the side of a hallway bed, pleasant and conversational in full sentences. Eyes: Conjunctivae are normal. PERRL.  EOMI. Head: Atraumatic. Nose: No congestion/rhinnorhea. Mouth/Throat: Mucous membranes are moist.  Oropharynx non-erythematous. Neck: No stridor. No cervical spine tenderness to palpation. Cardiovascular: Normal rate, regular rhythm. Grossly normal heart sounds.  Good peripheral circulation. Respiratory: Normal respiratory effort.  No retractions. Lungs CTAB. Gastrointestinal: Soft , nondistended, nontender to palpation. No CVA tenderness. Musculoskeletal: No lower extremity tenderness nor edema.  No joint effusions. No signs of acute trauma. Mild and poorly localizing R > L paraspinal lumbar tenderness to palpation.  No overlying skin changes or signs of trauma to the back.  No midline tenderness or bony step-offs. Neurologic:  Normal speech and language. No gross focal neurologic deficits are appreciated. No gait instability noted. Skin:  Skin is warm, dry and intact. No rash noted. Psychiatric: Mood and affect are normal. Speech and behavior are normal.  ____________________________________________   LABS (all labs ordered are listed, but only abnormal results are displayed)  Labs Reviewed  URINALYSIS, COMPLETE (UACMP) WITH MICROSCOPIC - Abnormal; Notable for the following components:      Result Value   Color, Urine YELLOW (*)    APPearance CLEAR (*)    Hgb urine dipstick MODERATE (*)    Ketones, ur 5 (*)    Bacteria, UA RARE (*)    All other components within normal limits   ____________________________________________  RADIOLOGY  ED MD interpretation:    Official radiology report(s): MR LUMBAR SPINE WO CONTRAST  Result Date: 10/15/2020 CLINICAL DATA:  Back pain. EXAM: MRI LUMBAR SPINE WITHOUT CONTRAST TECHNIQUE: Multiplanar, multisequence MR imaging of the lumbar spine was performed. No intravenous contrast was administered. COMPARISON:  Lumbar MRI 04/25/2020 FINDINGS: Segmentation:  Normal Alignment:  Normal Vertebrae:  Normal bone marrow.  Negative for fracture or mass.  Conus medullaris and cauda equina: Conus extends to the L1 level. Conus and cauda equina appear normal. Paraspinal and other soft tissues: Negative for paraspinous mass or adenopathy. Disc levels: T11-12: Small left-sided disc protrusion unchanged. Negative for stenosis T12-L1: Negative L1-2: Mild asymmetric facet degeneration on the left with mild subarticular stenosis on the left. No interval change. L2-3: Mild disc and mild facet degeneration.  Negative for stenosis L3-4: Mild disc bulging.  Negative for stenosis L4-5: Mild disc and mild facet degeneration.  Negative for stenosis L5-S1: Mild facet degeneration. Negative for neural impingement or stenosis. IMPRESSION: Negative for fracture Mild lumbar degenerative changes, stable from MRI of 04/25/2020. Electronically Signed   By: Franchot Gallo M.D.   On: 10/15/2020 20:54    ____________________________________________   PROCEDURES and INTERVENTIONS  Procedure(s) performed (including Critical Care):  Procedures  Medications - No data to display  ____________________________________________   MDM / ED COURSE   49 year old woman presents to the ED with back pain, without evidence of cord compression or significant pathology, likely representing muscular strain, and amenable to outpatient management.  Exam is generally reassuring without evidence of distress, trauma or neurovascular deficits.  She is ambulatory independently with a normal gait.  MRI performed and shows no evidence of cord compression, fracture or acute pathology.  Her urine  has no infectious features and her incontinence is likely more related to acute pain and overflow incontinence.  Her pain is well controlled here in the ED and I see no indications for further work-up.  She reports having a follow-up appointment tomorrow with her PCP already scheduled.  We discussed return precautions for the ED.  Patient medically stable for discharge.  Clinical Course as of Oct 17 211   Fri Oct 16, 2020  0140 Patient denies any medication for pain at this time, indicating that she will take it when she is at home.   [DS]    Clinical Course User Index [DS] Vladimir Crofts, MD    ____________________________________________   FINAL CLINICAL IMPRESSION(S) / ED DIAGNOSES  Final diagnoses:  Strain of lumbar region, initial encounter     ED Discharge Orders    None       Helmi Hechavarria Tamala Julian   Note:  This document was prepared using Dragon voice recognition software and may include unintentional dictation errors.   Vladimir Crofts, MD 10/16/20 [phone]

## 2020-10-16 NOTE — ED Notes (Signed)
Assumed care of pt upon being roomed. Reports hx of chronic back pain. Intermittently having shooting pains, numbness, and tingling to BLE. Reports buttocks feels numb and L calf at this time. Had episode of incontinence of bowel and urine a few minutes p onset of pain. Stool was formed and then loose. Pt remains having "leaking" from bladder at this time. Has no hx of leak incontinence. Pt AO x4, talking in full sentences with regular and unlabored breathing.

## 2020-10-16 NOTE — ED Notes (Signed)
Pt ambulated to restroom independently to provide UA with steady gait.

## 2020-10-16 NOTE — Discharge Instructions (Signed)
Please take Tylenol and ibuprofen/Advil for your pain.  It is safe to take them together, or to alternate them every few hours.  Take up to 1000mg  of Tylenol at a time, up to 4 times per day.  Do not take more than 4000 mg of Tylenol in 24 hours.  For ibuprofen, take 400-600 mg, 4-5 times per day.  I have attached some exercises that can be helpful for lower back strains.  These can be very helpful to help relax the muscles in your back and alleviate your pain.  Return to the ED with any significantly worsening symptoms, fevers with your back pain or inability to use the restroom.

## 2021-04-01 ENCOUNTER — Telehealth: Payer: Self-pay

## 2021-04-01 NOTE — Telephone Encounter (Signed)
We have received records on patient from [facility]. Attempt to reach patient to scheduled voicemail not set up. Unable to leave message.

## 2021-04-02 NOTE — Telephone Encounter (Signed)
Attempt to reach patient to scheduled voicemail not set up. Unable to leave message.

## 2021-04-21 ENCOUNTER — Encounter: Payer: Self-pay | Admitting: Obstetrics and Gynecology

## 2021-04-21 ENCOUNTER — Other Ambulatory Visit: Payer: Self-pay

## 2021-04-21 ENCOUNTER — Ambulatory Visit (INDEPENDENT_AMBULATORY_CARE_PROVIDER_SITE_OTHER): Payer: Medicaid Other | Admitting: Obstetrics and Gynecology

## 2021-04-21 VITALS — BP 132/78 | Ht 62.0 in | Wt 171.0 lb

## 2021-04-21 DIAGNOSIS — R32 Unspecified urinary incontinence: Secondary | ICD-10-CM

## 2021-04-21 DIAGNOSIS — L650 Telogen effluvium: Secondary | ICD-10-CM | POA: Diagnosis not present

## 2021-04-21 DIAGNOSIS — N951 Menopausal and female climacteric states: Secondary | ICD-10-CM | POA: Diagnosis not present

## 2021-04-21 DIAGNOSIS — R5383 Other fatigue: Secondary | ICD-10-CM

## 2021-04-21 NOTE — Progress Notes (Signed)
Obstetrics & Gynecology Office Visit   Chief Complaint:  Chief Complaint  Patient presents with  . Urinary Incontinence    Referral from [facility]. Have to wear a panty liner because she is wetting herself.  RM 4    History of Present Illness:  Amy Hubbard is a 50 y.o. female who presents self referred for evaluation of stress incontinence.   She reports that she has had the above symptoms for several months.  She does have a history of degenerative lumbar disc disease and chronic back pain and migraines.  She receives steroid injection with Dr. Alba Destine and is on gabapentin and meloxicam.  Taking nortriptyline for migraines and qualipta for migraines.    She has had two deliveries.  One vaginal, non-operative 5lbs infant, and on C-section.  She had a vaginal hysterectomy for menorrhagia 05/21/2019 by Dr. Rossie Muskrat.    Urinary Leakage Symptoms:  She reports that she does have urinary incontinence.    Stress incontinence:  She does report stress urinary leakage nd reports this to be severe bother.  She reports leaking of urine even with minimal exertion and sometimes unprovoked.   She has tried not tried previous treatment.  Urgency incontinence: She does not report urine leakage with urgency   She drinks 0 caffeinated beverages a day  Bladder Emptying/Upper Urinary Tract Symptoms:    Voiding frequency/nocturia: no polydipsia or nocturia  Bladder emptying: She  She does not splint to accomplish voiding.  Does double void.    Upper tract symptoms: Shedenies a history of kidney stones and denies gross hematuria.   UTI: She denies a history of frequent urinary tract infections.  Bulge Symptoms:  She denies pressure/bulge symptoms.    Bowel Symptoms:  She does suffer from chronic constipation  Fecal incontinence: She denies encopresis   Defecatory dysfunction: She does not splint to accomplish a bowel movement.    Review of Systems: Review of Systems  Constitutional:  Negative.   Gastrointestinal: Negative.   Genitourinary: Positive for frequency. Negative for dysuria, flank pain, hematuria and urgency.  Musculoskeletal: Positive for back pain.  Neurological: Positive for headaches.     Past Medical History:  Patient Active Problem List   Diagnosis Date Noted  . Postoperative state 05/31/2019    Past Surgical History:  Past Surgical History:  Procedure Laterality Date  . ABDOMINAL HYSTERECTOMY    . BREAST BIOPSY Right 03/17/2017   fibroadenoma, bx/clip  . IUD REMOVAL    . LAPAROSCOPIC VAGINAL HYSTERECTOMY WITH SALPINGECTOMY Bilateral 05/31/2019   Procedure: LAPAROSCOPIC ASSISTED VAGINAL HYSTERECTOMY WITH SALPINGECTOMY;  Surgeon: Schermerhorn, Gwen Her, MD;  Location: [facility] ORS;  Service: Gynecology;  Laterality: Bilateral;    Gynecologic History: Patient's last menstrual period was 05/15/2019.  Obstetric History: G2P0  Family History:  Family History  Problem Relation Age of Onset  . Breast cancer Neg Hx     Social History:  Social History   Socioeconomic History  . Marital status: Single    Spouse name: Not on file  . Number of children: Not on file  . Years of education: Not on file  . Highest education level: Not on file  Occupational History  . Not on file  Tobacco Use  . Smoking status: Never Smoker  . Smokeless tobacco: Never Used  Vaping Use  . Vaping Use: Never used  Substance and Sexual Activity  . Alcohol use: Yes    Alcohol/week: 2.0 standard drinks    Types: 2 Glasses of wine per week  .  Drug use: Never  . Sexual activity: Not Currently    Birth control/protection: Surgical    Comment: Hysterectomy  Other Topics Concern  . Not on file  Social History Narrative  . Not on file   Social Determinants of Health   Financial Resource Strain: Not on file  Food Insecurity: Not on file  Transportation Needs: Not on file  Physical Activity: Not on file  Stress: Not on file  Social Connections: Not on file   Intimate Partner Violence: Not on file    Allergies:  Allergies  Allergen Reactions  . Sumatriptan Other (See Comments)    Dizziness  Dizziness     Medications: Prior to Admission medications   Medication Sig Start Date End Date Taking? Authorizing Provider  Atogepant (QULIPTA) 60 MG TABS Take by mouth. 04/01/21 05/01/21 Yes [provider]  butalbital-acetaminophen-caffeine (FIORICET) 50-325-40 MG tablet Take 1 tablet by mouth every 4 (four) hours as needed. 04/01/21  Yes [provider]  diclofenac sodium (VOLTAREN) 1 % GEL Apply 2 g topically 2 (two) times daily as needed (back pain).   Yes [provider]  Erenumab-aooe (AIMOVIG) 140 MG/ML SOAJ Inject into the skin. 04/13/21  Yes [provider]  FLUoxetine (PROZAC) 10 MG tablet Take 10 mg by mouth every evening.  12/03/18  Yes [provider]  gabapentin (NEURONTIN) 300 MG capsule Take 1 capsule (300 mg total) by mouth at bedtime. 06/01/19  Yes Schermerhorn, Gwen Her, MD  HYDROcodone-acetaminophen (NORCO/VICODIN) 5-325 MG tablet Take 1 tablet by mouth 2 (two) times daily as needed. 01/12/21  Yes [provider]  magnesium oxide (MAG-OX) 400 MG tablet Take by mouth.   Yes [provider]  meloxicam (MOBIC) 15 MG tablet Take 1 tablet by mouth daily. 01/04/21  Yes [provider]  nortriptyline (PAMELOR) 50 MG capsule Take by mouth. 03/04/21 06/02/21 Yes [provider]  ondansetron (ZOFRAN) 4 MG tablet Take by mouth. 08/19/20  Yes [provider]  solifenacin (VESICARE) 5 MG tablet Take 5 mg by mouth daily. 03/29/21  Yes [provider]  Ubrogepant (UBRELVY) 100 MG TABS Take 1 tablet by mouth daily as needed. 04/01/21  Yes [provider]  acetaminophen (TYLENOL) 650 MG CR tablet Take by mouth.    [provider]  promethazine (PHENERGAN) 12.5 MG tablet Take by mouth.    [provider]    Physical Exam Vitals:   Vitals:   04/21/21 1041  BP: 132/78   Patient's last menstrual period was 05/15/2019.  General: NAD, well nourished appears stated age 50: normocephalic, anicteric Pulmonary: No increased work of breathing Genitourinary:  External: Normal external female genitalia.  Normal urethral meatus, normal Bartholin's and Skene's glands.    Vagina: Normal vaginal mucosa, no evidence of prolapse.  Poor muscle tone on Kegel.  Normal genital hiatus  Cervix: surgically absent  Uterus: surgically absent  Adnexa: ovaries non-enlarged, no adnexal masses  Rectal: deferred  Lymphatic: no evidence of inguinal lymphadenopathy Extremities: no edema, erythema, or tenderness Neurologic: Grossly intact Psychiatric: mood appropriate, affect full  PVR: N/A  Female chaperone present for pelvic portions of the physical exam  Assessment: 50 y.o. G2P0 presenting for evaluation of urinary incontinence  Plan: Problem List Items Addressed This Visit   None   Visit Diagnoses    Telogen effluvium    -  Primary   Relevant Orders   FSH   Estradiol   TSH   Perimenopausal vasomotor symptoms  Relevant Orders   FSH   Estradiol   TSH   Fatigue, unspecified type       Relevant Orders   FSH   Estradiol   TSH   Urinary incontinence in female       Relevant Medications   solifenacin (VESICARE) 5 MG tablet   Other Relevant Orders   Ambulatory referral to Urogynecology     1) Urinary incontinence - sound like SUI but at times unprovoked so potential for component of neurogenic bladder. - Will refer to Urogynecology for possible urodynamics - Pelvic PT referral  2) Vasomotor symptoms/telogen effluvium/fatigue - will check TSH, FSH/estradiol  3) A total of 30 minutes were spent in face-to-face contact with the patient during this encounter with over half of that time devoted to counseling and coordination of care.  4) Return if symptoms worsen or fail to improve.   Malachy Mood, MD,  [facility], [facility] 04/21/2021, 11:26 AM

## 2021-04-22 LAB — TSH: TSH: 2.77 u[IU]/mL (ref 0.450–4.500)

## 2021-04-22 LAB — ESTRADIOL: Estradiol: 211 pg/mL

## 2021-04-22 LAB — FOLLICLE STIMULATING HORMONE: FSH: 5.6 m[IU]/mL

## 2021-04-29 ENCOUNTER — Other Ambulatory Visit: Payer: Self-pay

## 2021-04-29 ENCOUNTER — Encounter: Payer: Self-pay | Admitting: Physical Therapy

## 2021-04-29 ENCOUNTER — Ambulatory Visit: Payer: Medicaid Other | Attending: Obstetrics and Gynecology | Admitting: Physical Therapy

## 2021-04-29 DIAGNOSIS — M6281 Muscle weakness (generalized): Secondary | ICD-10-CM | POA: Diagnosis present

## 2021-04-29 DIAGNOSIS — R278 Other lack of coordination: Secondary | ICD-10-CM | POA: Insufficient documentation

## 2021-04-29 DIAGNOSIS — R293 Abnormal posture: Secondary | ICD-10-CM | POA: Insufficient documentation

## 2021-04-29 NOTE — Therapy (Signed)
[facility] [address] [place], [place], [address] Phone: [phone]   Fax:  [phone]  Physical Therapy Evaluation  Patient Details  Name: Amy Hubbard MRN: [id number] Date of Birth: Sep 06, 1971 Referring Provider (PT): Malachy Mood   Encounter Date: 04/29/2021   PT End of Session - 04/29/21 0852     Visit Number 1    Number of Visits 12    Date for PT Re-Evaluation 07/22/21    Authorization Type IE 04/29/2021    PT Start Time 0845    PT Stop Time 0925    PT Time Calculation (min) 40 min    Activity Tolerance Patient tolerated treatment well    Behavior During Therapy [facility] for tasks assessed/performed             Past Medical History:  Diagnosis Date   Anxiety    Complication of anesthesia    PONV (postoperative nausea and vomiting)    Uterine fibroid     Past Surgical History:  Procedure Laterality Date   ABDOMINAL HYSTERECTOMY     BREAST BIOPSY Right 03/17/2017   fibroadenoma, bx/clip   CESAREAN SECTION N/A 10/29/2005   IUD REMOVAL     LAPAROSCOPIC VAGINAL HYSTERECTOMY WITH SALPINGECTOMY Bilateral 05/31/2019   Procedure: LAPAROSCOPIC ASSISTED VAGINAL HYSTERECTOMY WITH SALPINGECTOMY;  Surgeon: Boykin Nearing, MD;  Location: [facility] ORS;  Service: Gynecology;  Laterality: Bilateral;    There were no vitals filed for this visit.        [facility] PT Assessment - 04/29/21 0001       Assessment   Medical Diagnosis Mixed UI    Referring Provider (PT) Georgianne Fick, Andreas    Hand Dominance Right    Prior Therapy None for this dx      Balance Screen   Has the patient fallen in the past 6 months No            PELVIC HEALTH PHYSICAL THERAPY EVALUATION  SCREENING Red Flags: None Have you had any night sweats? Unexplained weight loss? Saddle anesthesia? Unexplained changes in bowel or bladder habits?  Precautions: None  SUBJECTIVE  Chief Complaint: Starting in Dec 2021 when moving  christmas decorations and picking up a bin she felt her back catch. When she went to take a few steps, she started urinating. After going to void, she felt herself continuing to void and a strong urge to defecate. Patient was able to make it to the toilet to defecate, but was advised by her pain management MD to go to the ED. At the ED, imaging was performed which ruled out CES. Patient then followed up with GYN and was referred for PFPT.   Patient notes that at this point UI is happening "whenever." Patient does reports some post-void dribble. Patient also has inconsistent leakage with sneezing. Patient reports that sitting here during the subjective, she felt herself leak. Patient has noted with increased back pain/spasm she has increased leakage. Back pain is B.   Pertinent History:  Falls Negative.  Scoliosis Negative. Pulmonary disease/dysfunction Negative. Surgical history: Positive for see above.    Obstetrical History: G2P2 Deliveries: vaginal, c-section Tearing/Episiotomy: n/a Birthing position: back  Gynecological History: Hysterectomy: Yes laparoscopic Endometriosis: Negative Pelvic Organ Prolapse: Negative Last Menstrual Period:  Pain with exam: No Heaviness/pressure: No    Urinary History: Incontinence: Positive. Onset: Dec 2021 Triggers: sneezing, no apparent cause. Inconsistent with long walks and physical activity. Sleeping. Amount: Min/Mod. Protective undergarments: Yes  Type: poise pad #3  Number used/day: 2x Fluid Intake: 4-5x 30 oz H20, nothing caffeinated, occasional juices Nocturia: 3-4x/night Frequency of urination: every 1 hours Toileting posture: feet flat Pain with urination: Negative Difficulty initiating urination: Positive for rare after starting Vesicare. Not constant.  Intermittent stream: Positive Post-void dribble: Positive for 75%.  Urge: present not linked to incontinence. Frequent UTI: Negative.   Gastrointestinal History: [place] Stool  Chart: Type 1 (occasional 2, 3, 4) Frequency of BMs: 1x/week (patient notes this has been her whole life) Pain with defecation: Negative Straining with defecation: Positive for 100%. Hemorrhoids: Negative Toileting posture: feet tucked Incontinence: Negative.   Sexual activity/pain: Pain with intercourse: Negative.   Initial penetration: No  Deep thrusting: No External stimulation: No  Location of pain: low back Current pain:  6/10  Max pain:  7/10;  Least pain:  0/10 (after injection) Pain quality: pain quality: uncomfortable, annoying Radiating pain: Yes ; LLE to L great toe  Current activities:  Walking; road trips  Patient Goals:  "Get to the point to where I'm not leaking as much"; "avoid surgery"  OBJECTIVE  Mental Status Patient is oriented to person, place and time.  Recent memory is intact.  Remote memory is intact.  Attention span and concentration are intact.  Expressive speech is intact.  Patient's fund of knowledge is within normal limits for educational level.  POSTURE/OBSERVATIONS:  Lumbar lordosis: mildly diminished Thoracic kyphosis: increased in length and curve Iliac crest height: R elevated Pelvic obliquity: non apparent on visual assessment  GAIT: Patient ambulates with L Trendelenburg and decreased stride length B, R>L.  RANGE OF MOTION: deferred 2/2 to time constraints   LEFT RIGHT  Lumbar forward flexion (65):      Lumbar extension (30):     Lumbar lateral flexion (25):     Thoracic and Lumbar rotation (30 degrees):       Hip Flexion (0-125):      Hip IR (0-45):     Hip ER (0-45):     Hip Abduction (0-40):     Hip extension (0-15):       SENSATION: deferred 2/2 to time constraints  STRENGTH: MMT deferred 2/2 to time constraints  RLE LLE  Hip Flexion    Hip Extension    Hip Abduction     Hip Adduction     Hip ER     Hip IR     Knee Extension    Knee Flexion    Dorsiflexion     Plantarflexion (seated)     ABDOMINAL:  deferred 2/2 to time constraints Palpation: Diastasis: Scar mobility: Rib flare:  SPECIAL TESTS: deferred 2/2 to time constraints   PHYSICAL PERFORMANCE MEASURES: STS: WNL     EXTERNAL PELVIC EXAM: deferred 2/2 to time constraints Palpation: Breath coordination: Voluntary Contraction: present/absent Relaxation: full/delayed/non-relaxing Perineal movement with sustained IAP increase ("bear down"): descent/no change/elevation/excessive descent Perineal movement with rapid IAP increase ("cough"): elevation/no change/descent  INTERNAL VAGINAL EXAM: deferred 2/2 to time constraints Introitus Appears:  Skin integrity:  Scar mobility: Strength (PERF):  Symmetry: Palpation: Prolapse:   INTERNAL RECTAL EXAM: not indicated Strength (PERF): Symmetry: Palpation: Prolapse:   OUTCOME MEASURES: FOTO (44)   ASSESSMENT Patient is a 50 year old presenting to clinic with chief complaints of urinary incontinence. Today's evaluation is suggestive of deficits in posture, PFM coordination, PFM strength, IAP management as evidenced by straining with defecation 100% of the time, incontinence with sneezing, functional incontinence, post-micturition dribble, intermittent stream, and elevated R iliac crest. Patient's responses on FOTO  Urinary outcome measures (44) indicate significant functional limitations/disability/distress. Patient's progress may be limited due to limitations associated with lumbar DDD; however, patient's motivation is advantageous. Patient's subjective history reassuring for adequate pelvic support at this time. Patient was able to achieve basic understanding of PFM functions during today's evaluation and responded positively to educational interventions. Patient will benefit from continued skilled therapeutic intervention to address deficits in osture, PFM coordination, PFM strength, IAP management in order to increase function, and improve overall QOL.  EDUCATION Patient  educated on prognosis, POC, and provided with HEP including: bladder diary. Patient articulated understanding and returned demonstration. Patient will benefit from further education in order to maximize compliance and understanding for long-term therapeutic gains.  TREATMENT Neuromuscular Re-education: Patient educated on primary functions of the pelvic floor including: posture/balance, sexual pleasure, storage and elimination of waste from the body, abdominal cavity closure, and breath coordination.     Objective measurements completed on examination: See above findings.                    PT Long Term Goals - 04/29/21 0951       PT LONG TERM GOAL #1   Title Patient will demonstrate independence with HEP in order to maximize therapeutic gains and improve carryover from physical therapy sessions to ADLs in the home and community.    Baseline IE: not demonstrated    Time 12    Period Weeks    Status New    Target Date 07/22/21      PT LONG TERM GOAL #2   Title Patient will demonstrate circumferential and sequential contraction of >4/5 MMT, > 6 sec hold x10 and 5 consecutive quick flicks with </= 10 min rest between testing bouts, and relaxation of the PFM coordinated with breath for improved management of intra-abdominal pressure and normal bowel and bladder function without the presence of pain nor incontinence in order to improve participation at home and in the community.    Baseline IE: assessed at next visit    Time 12    Period Weeks    Status New    Target Date 07/22/21      PT LONG TERM GOAL #3   Title Patient will demonstrate coordinated lengthening and relaxation of PFM with diaphragmatic inhalation in order to decrease spasm and allow for unrestricted elimination of urine/feces for improved overall QOL.    Baseline IE: assessed at next visit    Time 12    Period Weeks    Status New    Target Date 07/22/21      PT LONG TERM GOAL #4   Title Patient will  demonstrate improved function as evidenced by a score of 55 on FOTO measure for full participation in activities at home and in the community.    Baseline IE: 44    Time 12    Period Weeks    Status New    Target Date 07/22/21      PT LONG TERM GOAL #5   Title Patient will report decreased reliance on protective undergarments as indicated by use of Poise Type 2 or 1 at frequency of 1 or none during sleeping hours to demonstrate improved bladder control and allow for increased participation in activities outside of the home.    Baseline IE: Type 3, x1    Time 12    Period Weeks    Status New    Target Date 07/22/21  Plan - 04/29/21 0853     Clinical Impression Statement Patient is a 50 year old presenting to clinic with chief complaints of urinary incontinence. Today's evaluation is suggestive of deficits in posture, PFM coordination, PFM strength, IAP management as evidenced by straining with defecation 100% of the time, incontinence with sneezing, functional incontinence, post-micturition dribble, intermittent stream, and elevated R iliac crest. Patient's responses on FOTO Urinary outcome measures (44) indicate significant functional limitations/disability/distress. Patient's progress may be limited due to limitations associated with lumbar DDD; however, patient's motivation is advantageous. Patient's subjective history reassuring for adequate pelvic support at this time. Patient was able to achieve basic understanding of PFM functions during today's evaluation and responded positively to educational interventions. Patient will benefit from continued skilled therapeutic intervention to address deficits in osture, PFM coordination, PFM strength, IAP management in order to increase function, and improve overall QOL.    Personal Factors and Comorbidities Behavior Pattern;Comorbidity 3+;Past/Current Experience;Time since onset of injury/illness/exacerbation;Age;Profession     Comorbidities anxiety, migraines, arthritis, depression, chronic LBP    Examination-Activity Limitations Continence;Sleep;Lift;Stand;Locomotion Level    Examination-Participation Restrictions Occupation;Community Activity;Laundry    Stability/Clinical Decision Making Evolving/Moderate complexity    Clinical Decision Making Moderate    Rehab Potential Good    PT Frequency 1x / week    PT Duration 12 weeks    PT Treatment/Interventions ADLs/Self Care Home Management;Cryotherapy;Electrical Stimulation;Moist Heat;Therapeutic activities;Therapeutic exercise;Neuromuscular re-education;Patient/family education;Orthotic Fit/Training;Manual techniques;Scar mobilization;Joint Manipulations;Spinal Manipulations;Taping    PT Next Visit Plan physical assessment    PT Home Exercise Plan bladder diary    Consulted and Agree with Plan of Care Patient             Patient will benefit from skilled therapeutic intervention in order to improve the following deficits and impairments:  Abnormal gait, Decreased coordination, Decreased endurance, Decreased strength, Increased muscle spasms, Postural dysfunction, Improper body mechanics, Pain  Visit Diagnosis: Other lack of coordination  Abnormal posture  Muscle weakness (generalized)     Problem List Patient Active Problem List   Diagnosis Date Noted   Postoperative state 05/31/2019    Myles Gip PT, DPT [phone]  04/29/2021, 9:55 AM  [facility] [address] [place], [place], [address] Phone: [phone]   Fax:  [phone]  Name: Amy Hubbard MRN: [id number] Date of Birth: 1971/05/19

## 2021-05-05 ENCOUNTER — Ambulatory Visit: Payer: Medicaid Other | Admitting: Physical Therapy

## 2021-05-05 ENCOUNTER — Other Ambulatory Visit: Payer: Self-pay

## 2021-05-05 ENCOUNTER — Encounter: Payer: Self-pay | Admitting: Physical Therapy

## 2021-05-05 DIAGNOSIS — M6281 Muscle weakness (generalized): Secondary | ICD-10-CM

## 2021-05-05 DIAGNOSIS — R293 Abnormal posture: Secondary | ICD-10-CM

## 2021-05-05 DIAGNOSIS — R278 Other lack of coordination: Secondary | ICD-10-CM

## 2021-05-05 NOTE — Therapy (Signed)
[facility] [address] [place], [place], [address] Phone: [phone]   Fax:  [phone]  Physical Therapy Treatment  Patient Details  Name: Amy Hubbard MRN: [id number] Date of Birth: August 20, 1971 Referring Provider (PT): Malachy Mood   Encounter Date: 05/05/2021   PT End of Session - 05/05/21 1104     Visit Number 2    Number of Visits 12    Date for PT Re-Evaluation 07/22/21    Authorization Type IE 04/29/2021    PT Start Time 1100    PT Stop Time 1140    PT Time Calculation (min) 40 min    Activity Tolerance Patient tolerated treatment well    Behavior During Therapy Amy Hubbard for tasks assessed/performed             Past Medical History:  Diagnosis Date   Anxiety    Complication of anesthesia    PONV (postoperative nausea and vomiting)    Uterine fibroid     Past Surgical History:  Procedure Laterality Date   ABDOMINAL HYSTERECTOMY     BREAST BIOPSY Right 03/17/2017   fibroadenoma, bx/clip   CESAREAN SECTION N/A 10/29/2005   IUD REMOVAL     LAPAROSCOPIC VAGINAL HYSTERECTOMY WITH SALPINGECTOMY Bilateral 05/31/2019   Procedure: LAPAROSCOPIC ASSISTED VAGINAL HYSTERECTOMY WITH SALPINGECTOMY;  Surgeon: Boykin Nearing, MD;  Location: [facility] ORS;  Service: Gynecology;  Laterality: Bilateral;    There were no vitals filed for this visit.   Subjective Assessment - 05/05/21 1102     Subjective Patient notes that she has had increased back pain since Saturday; notes no specific trauma or overuse, but woke up with increased pain. Patient reports that on waking she had some UI but this has largely not changed with the addition of increased back pain. Patient did keep bladder diary but did not bring it in with her; she estimates that she empties for roughly 10 sec. She is in the habit of postural changes while on the toilet to encourage more emptying.    Currently in Pain? Yes    Pain Score 7     Pain Location  Back    Pain Orientation Right;Left;Lower             TREATMENT  Manual Therapy: STM and TPR performed to B lumbar and gluteal mm complexes to allow for decreased tension and pain and improved posture and function using vibratory and percussive device Lumbar CPA and UPA mobilizations for decreased spasm and improved mobility, grade II/III Sacral border mobilizations for decreased spasm and improved mobility  Neuromuscular Re-education: Child's Pose with diaphragmatic breathing, for improved low back posture and pain modulation  Supine hooklying trunk rotations with coordinated breath for improved lumbar mobility and decreased pain Supine single knee to chest with diaphragmatic breath for improved lumbar mobility and decreased pain, BLE Supine double knee to chest with diaphragmatic breath for improved lumbar mobility and decreased pain Supine pelvic tilts with coordinated breath for improved lumbar mobility and decreased pain   Patient educated throughout session on appropriate technique and form using multi-modal cueing, HEP, and activity modification. Patient articulated understanding and returned demonstration.  Patient Response to interventions: Comfortable with stretching program  ASSESSMENT Patient presents to clinic with excellent motivation to participate in therapy. Patient demonstrates deficits in posture, PFM coordination, PFM strength, IAP management. Patient able to achieve significant pain reduction during today's session and responded positively to manual and active interventions. Patient will benefit from continued skilled therapeutic intervention  to address remaining deficits in posture, PFM coordination, PFM strength, IAP management in order to increase function and improve overall QOL.     PT Long Term Goals - 04/29/21 0951       PT LONG TERM GOAL #1   Title Patient will demonstrate independence with HEP in order to maximize therapeutic gains and improve  carryover from physical therapy sessions to ADLs in the home and community.    Baseline IE: not demonstrated    Time 12    Period Weeks    Status New    Target Date 07/22/21      PT LONG TERM GOAL #2   Title Patient will demonstrate circumferential and sequential contraction of >4/5 MMT, > 6 sec hold x10 and 5 consecutive quick flicks with </= 10 min rest between testing bouts, and relaxation of the PFM coordinated with breath for improved management of intra-abdominal pressure and normal bowel and bladder function without the presence of pain nor incontinence in order to improve participation at home and in the community.    Baseline IE: assessed at next visit    Time 12    Period Weeks    Status New    Target Date 07/22/21      PT LONG TERM GOAL #3   Title Patient will demonstrate coordinated lengthening and relaxation of PFM with diaphragmatic inhalation in order to decrease spasm and allow for unrestricted elimination of urine/feces for improved overall QOL.    Baseline IE: assessed at next visit    Time 12    Period Weeks    Status New    Target Date 07/22/21      PT LONG TERM GOAL #4   Title Patient will demonstrate improved function as evidenced by a score of 55 on FOTO measure for full participation in activities at home and in the community.    Baseline IE: 44    Time 12    Period Weeks    Status New    Target Date 07/22/21      PT LONG TERM GOAL #5   Title Patient will report decreased reliance on protective undergarments as indicated by use of Poise Type 2 or 1 at frequency of 1 or none during sleeping hours to demonstrate improved bladder control and allow for increased participation in activities outside of the home.    Baseline IE: Type 3, x1    Time 12    Period Weeks    Status New    Target Date 07/22/21                   Plan - 05/05/21 1104     Clinical Impression Statement Patient presents to clinic with excellent motivation to participate in  therapy. Patient demonstrates deficits in posture, PFM coordination, PFM strength, IAP management. Patient able to achieve significant pain reduction during today's session and responded positively to manual and active interventions. Patient will benefit from continued skilled therapeutic intervention to address remaining deficits in posture, PFM coordination, PFM strength, IAP management in order to increase function and improve overall QOL.    Personal Factors and Comorbidities Behavior Pattern;Comorbidity 3+;Past/Current Experience;Time since onset of injury/illness/exacerbation;Age;Profession    Comorbidities anxiety, migraines, arthritis, depression, chronic LBP    Examination-Activity Limitations Continence;Sleep;Lift;Stand;Locomotion Level    Examination-Participation Restrictions Occupation;Community Activity;Laundry    Stability/Clinical Decision Making Evolving/Moderate complexity    Rehab Potential Good    PT Frequency 1x / week    PT Duration 12 weeks  PT Treatment/Interventions ADLs/Self Care Home Management;Cryotherapy;Electrical Stimulation;Moist Heat;Therapeutic activities;Therapeutic exercise;Neuromuscular re-education;Patient/family education;Orthotic Fit/Training;Manual techniques;Scar mobilization;Joint Manipulations;Spinal Manipulations;Taping    PT Next Visit Plan physical assessment    PT Home Exercise Plan bladder diary    Consulted and Agree with Plan of Care Patient             Patient will benefit from skilled therapeutic intervention in order to improve the following deficits and impairments:  Abnormal gait, Decreased coordination, Decreased endurance, Decreased strength, Increased muscle spasms, Postural dysfunction, Improper body mechanics, Pain  Visit Diagnosis: Other lack of coordination  Abnormal posture  Muscle weakness (generalized)     Problem List Patient Active Problem List   Diagnosis Date Noted   Postoperative state 05/31/2019   Myles Gip PT, DPT [phone]  05/05/2021, 1:27 PM  [facility] [address] [place], [place], [address] Phone: [phone]   Fax:  [phone]  Name: Amy Hubbard MRN: [id number] Date of Birth: 11-22-1970

## 2021-05-06 ENCOUNTER — Ambulatory Visit: Payer: Medicaid Other | Admitting: Physical Therapy

## 2021-05-12 ENCOUNTER — Ambulatory Visit: Payer: Medicaid Other | Admitting: Physical Therapy

## 2021-05-12 ENCOUNTER — Encounter: Payer: Self-pay | Admitting: Physical Therapy

## 2021-05-12 ENCOUNTER — Other Ambulatory Visit: Payer: Self-pay

## 2021-05-12 DIAGNOSIS — R278 Other lack of coordination: Secondary | ICD-10-CM

## 2021-05-12 DIAGNOSIS — M6281 Muscle weakness (generalized): Secondary | ICD-10-CM

## 2021-05-12 DIAGNOSIS — R293 Abnormal posture: Secondary | ICD-10-CM

## 2021-05-12 NOTE — Therapy (Signed)
[facility] [address] [place], [place], [address] Phone: [phone]   Fax:  [phone]  Physical Therapy Treatment  Patient Details  Name: Amy Hubbard MRN: [id number] Date of Birth: 07-Sep-1971 Referring Provider (PT): Malachy Mood   Encounter Date: 05/12/2021   PT End of Session - 05/12/21 1122     Visit Number 3    Number of Visits 12    Date for PT Re-Evaluation 07/22/21    Authorization Type IE 04/29/2021    PT Start Time 1100    PT Stop Time 1140    PT Time Calculation (min) 40 min    Activity Tolerance Patient tolerated treatment well    Behavior During Therapy [facility] for tasks assessed/performed             Past Medical History:  Diagnosis Date   Anxiety    Complication of anesthesia    PONV (postoperative nausea and vomiting)    Uterine fibroid     Past Surgical History:  Procedure Laterality Date   ABDOMINAL HYSTERECTOMY     BREAST BIOPSY Right 03/17/2017   fibroadenoma, bx/clip   CESAREAN SECTION N/A 10/29/2005   IUD REMOVAL     LAPAROSCOPIC VAGINAL HYSTERECTOMY WITH SALPINGECTOMY Bilateral 05/31/2019   Procedure: LAPAROSCOPIC ASSISTED VAGINAL HYSTERECTOMY WITH SALPINGECTOMY;  Surgeon: Boykin Nearing, MD;  Location: [facility] ORS;  Service: Gynecology;  Laterality: Bilateral;    There were no vitals filed for this visit.   Subjective Assessment - 05/12/21 1104     Subjective Patient reports that she has had increased back pain and urinary leakage. Patient has continued to leak during sleep, driving, and walking. Patient also had bout of diarrhea which she had limited ability to control. Patient has some tingling in the distal portion of the saddle region. Patient has had increased spasm in L thigh and B feet and calves.    Currently in Pain? Yes    Pain Score 5     Pain Location Back    Pain Orientation Mid            TREATMENT  Neuromuscular Re-education: EXTERNAL PELVIC  EXAM: Patient educated on the purpose of the pelvic exam and articulated understanding; patient consented to the exam verbally. Palpation: Breath coordination: present Voluntary Contraction: absent Relaxation: non-relaxing Perineal movement with sustained IAP increase ("bear down"): no change Perineal movement with rapid IAP increase ("cough"): descent  ABDOMINAL:  Palpation: no TTP Diastasis: none present on testing, but apparent rectus dominance Rib flare: mild R  REFLEXES: Clonus: Negative R patellar: 1+, L patellar: 3+ R achilles: 1+, L achilles 1+  Supine hooklying diaphragmatic breathing with VCs and TCs for downregulation of the nervous system and improved management of IAP Sidelying, thoracolumbar rotations (open-book) bilaterally with diaphragmatic breathing for improved diaphragmatic and rib cage excursion. VCs and TCs to prevent compensations.     Patient educated throughout session on appropriate technique and form using multi-modal cueing, HEP, and activity modification. Patient articulated understanding and returned demonstration.  Patient Response to interventions: Apply open book with HEP  ASSESSMENT Patient presents to clinic with excellent motivation to participate in therapy. Patient demonstrates deficits in posture, PFM coordination, PFM strength, IAP management. Patient unable to coordinate contraction of PFM during today's session but responded positively to thoracic mobility interventions. Of note, patient reporting increased UI and back pain over the past week with limited bowel control. Patient also reporting tingling in the saddle region. Patient following up with physiatry on  July 11, but advised to monitor symptoms closely for profound weakness or complete loss of bowel/urinary control. Patient will benefit from continued skilled therapeutic intervention to address remaining deficits in posture, PFM coordination, PFM strength, IAP management in order to  increase function and improve overall QOL.     PT Long Term Goals - 04/29/21 0951       PT LONG TERM GOAL #1   Title Patient will demonstrate independence with HEP in order to maximize therapeutic gains and improve carryover from physical therapy sessions to ADLs in the home and community.    Baseline IE: not demonstrated    Time 12    Period Weeks    Status New    Target Date 07/22/21      PT LONG TERM GOAL #2   Title Patient will demonstrate circumferential and sequential contraction of >4/5 MMT, > 6 sec hold x10 and 5 consecutive quick flicks with </= 10 min rest between testing bouts, and relaxation of the PFM coordinated with breath for improved management of intra-abdominal pressure and normal bowel and bladder function without the presence of pain nor incontinence in order to improve participation at home and in the community.    Baseline IE: assessed at next visit    Time 12    Period Weeks    Status New    Target Date 07/22/21      PT LONG TERM GOAL #3   Title Patient will demonstrate coordinated lengthening and relaxation of PFM with diaphragmatic inhalation in order to decrease spasm and allow for unrestricted elimination of urine/feces for improved overall QOL.    Baseline IE: assessed at next visit    Time 12    Period Weeks    Status New    Target Date 07/22/21      PT LONG TERM GOAL #4   Title Patient will demonstrate improved function as evidenced by a score of 55 on FOTO measure for full participation in activities at home and in the community.    Baseline IE: 44    Time 12    Period Weeks    Status New    Target Date 07/22/21      PT LONG TERM GOAL #5   Title Patient will report decreased reliance on protective undergarments as indicated by use of Poise Type 2 or 1 at frequency of 1 or none during sleeping hours to demonstrate improved bladder control and allow for increased participation in activities outside of the home.    Baseline IE: Type 3, x1    Time  12    Period Weeks    Status New    Target Date 07/22/21                   Plan - 05/12/21 1122     Clinical Impression Statement Patient presents to clinic with excellent motivation to participate in therapy. Patient demonstrates deficits in posture, PFM coordination, PFM strength, IAP management. Patient unable to coordinate contraction of PFM during today's session but responded positively to thoracic mobility interventions. Of note, patient reporting increased UI and back pain over the past week with limited bowel control. Patient also reporting tingling in the saddle region. Patient following up with physiatry on July 11, but advised to monitor symptoms closely for profound weakness or complete loss of bowel/urinary control. Patient will benefit from continued skilled therapeutic intervention to address remaining deficits in posture, PFM coordination, PFM strength, IAP management in order to increase function and  improve overall QOL.    Personal Factors and Comorbidities Behavior Pattern;Comorbidity 3+;Past/Current Experience;Time since onset of injury/illness/exacerbation;Age;Profession    Comorbidities anxiety, migraines, arthritis, depression, chronic LBP    Examination-Activity Limitations Continence;Sleep;Lift;Stand;Locomotion Level    Examination-Participation Restrictions Occupation;Community Activity;Laundry    Stability/Clinical Decision Making Evolving/Moderate complexity    Rehab Potential Good    PT Frequency 1x / week    PT Duration 12 weeks    PT Treatment/Interventions ADLs/Self Care Home Management;Cryotherapy;Electrical Stimulation;Moist Heat;Therapeutic activities;Therapeutic exercise;Neuromuscular re-education;Patient/family education;Orthotic Fit/Training;Manual techniques;Scar mobilization;Joint Manipulations;Spinal Manipulations;Taping    PT Home Exercise Plan bladder diary    Consulted and Agree with Plan of Care Patient             Patient will  benefit from skilled therapeutic intervention in order to improve the following deficits and impairments:  Abnormal gait, Decreased coordination, Decreased endurance, Decreased strength, Increased muscle spasms, Postural dysfunction, Improper body mechanics, Pain  Visit Diagnosis: Other lack of coordination  Abnormal posture  Muscle weakness (generalized)     Problem List Patient Active Problem List   Diagnosis Date Noted   Postoperative state 05/31/2019    Myles Gip PT, DPT [phone]  05/12/2021, 1:25 PM  [facility] [address] [place], [place], [address] Phone: [phone]   Fax:  [phone]  Name: Amy Hubbard MRN: [id number] Date of Birth: 04-May-1971

## 2021-05-20 ENCOUNTER — Encounter: Payer: Medicaid Other | Admitting: Physical Therapy

## 2021-05-26 ENCOUNTER — Encounter: Payer: Medicaid Other | Admitting: Physical Therapy

## 2021-05-27 ENCOUNTER — Encounter: Payer: Medicaid Other | Admitting: Physical Therapy

## 2021-06-02 ENCOUNTER — Other Ambulatory Visit: Payer: Self-pay

## 2021-06-02 ENCOUNTER — Encounter: Payer: Self-pay | Admitting: Physical Therapy

## 2021-06-02 ENCOUNTER — Ambulatory Visit: Payer: Medicaid Other | Attending: Obstetrics and Gynecology | Admitting: Physical Therapy

## 2021-06-02 DIAGNOSIS — R293 Abnormal posture: Secondary | ICD-10-CM | POA: Diagnosis present

## 2021-06-02 DIAGNOSIS — M6281 Muscle weakness (generalized): Secondary | ICD-10-CM | POA: Diagnosis present

## 2021-06-02 DIAGNOSIS — R278 Other lack of coordination: Secondary | ICD-10-CM | POA: Diagnosis present

## 2021-06-02 NOTE — Therapy (Signed)
[facility] [address]. [place], [place], [address] Phone: [phone]   Fax:  [phone]  Physical Therapy Treatment  Patient Details  Name: Amy Hubbard MRN: [id number] Date of Birth: 1970-11-25 Referring Provider (PT): Malachy Mood   Encounter Date: 06/02/2021   PT End of Session - 06/02/21 1120     Visit Number 4    Number of Visits 12    Date for PT Re-Evaluation 07/22/21    Authorization Type IE 04/29/2021    PT Start Time 1115    PT Stop Time 1155    PT Time Calculation (min) 40 min    Activity Tolerance Patient tolerated treatment well    Behavior During Therapy [facility] for tasks assessed/performed             Past Medical History:  Diagnosis Date   Anxiety    Complication of anesthesia    PONV (postoperative nausea and vomiting)    Uterine fibroid     Past Surgical History:  Procedure Laterality Date   ABDOMINAL HYSTERECTOMY     BREAST BIOPSY Right 03/17/2017   fibroadenoma, bx/clip   CESAREAN SECTION N/A 10/29/2005   IUD REMOVAL     LAPAROSCOPIC VAGINAL HYSTERECTOMY WITH SALPINGECTOMY Bilateral 05/31/2019   Procedure: LAPAROSCOPIC ASSISTED VAGINAL HYSTERECTOMY WITH SALPINGECTOMY;  Surgeon: Boykin Nearing, MD;  Location: [facility] ORS;  Service: Gynecology;  Laterality: Bilateral;    There were no vitals filed for this visit.   Subjective Assessment - 06/02/21 1118     Subjective Patient notes that she has some soreness from her epidural injection. Patient notes modest improvement in UI symptoms, and only ahd 1 instance of UI while away on vacation. Patient does still correlate increased leakage with increased back pain. Patient report enuresis is improving as well and she has had dry undergarments on waking a few times.    Currently in Pain? No/denies    Pain Location Back    Pain Descriptors / Indicators Sore;Other (Comment)   Stiffness           TREATMENT  Neuromuscular  Re-education: Quadruped diaphragmatic breathing with VCs and TCs for improved management of IAP Countertop diaphragmatic breathing with VCs and TCs for improved management of IAP Countertop TrA activation with coordinated breath for improved postural control and IAP management Supine hooklying diaphragmatic breathing with VCs and TCs for downregulation of the nervous system and improved management of IAP Supine hooklying diaphragmatic breathing with VCs and TCs for downregulation of the nervous system and improved management of IAP Supine hooklying, PFM lengthening with inhalation. VCs and TCs to decrease compensatory patterns and encourage optimal relaxation of the PFM. Supine hooklying, PFM contractions (x2) with exhalation. VCs and TCs to decrease compensatory patterns and encourage activation of the PFM. Supine hooklying, TrA activation with exhalation (x5). VCs and TCs to decrease compensatory patterns and minimize aggravation of the lumbar paraspinals.      Patient educated throughout session on appropriate technique and form using multi-modal cueing, HEP, and activity modification. Patient articulated understanding and returned demonstration.  Patient Response to interventions: Comfortable to work on TrA at home; return in 1 week  ASSESSMENT Patient presents to clinic with excellent motivation to participate in therapy. Patient demonstrates deficits in posture, PFM coordination, PFM strength, IAP management. Patient with improved ability to voluntarily contract PFM as well as deep core during today's session and responded positively to active interventions. Patient will benefit from continued skilled therapeutic intervention to address remaining deficits  in posture, PFM coordination, PFM strength, IAP management in order to increase function and improve overall QOL.    PT Long Term Goals - 04/29/21 0951       PT LONG TERM GOAL #1   Title Patient will demonstrate independence with HEP  in order to maximize therapeutic gains and improve carryover from physical therapy sessions to ADLs in the home and community.    Baseline IE: not demonstrated    Time 12    Period Weeks    Status New    Target Date 07/22/21      PT LONG TERM GOAL #2   Title Patient will demonstrate circumferential and sequential contraction of >4/5 MMT, > 6 sec hold x10 and 5 consecutive quick flicks with </= 10 min rest between testing bouts, and relaxation of the PFM coordinated with breath for improved management of intra-abdominal pressure and normal bowel and bladder function without the presence of pain nor incontinence in order to improve participation at home and in the community.    Baseline IE: assessed at next visit    Time 12    Period Weeks    Status New    Target Date 07/22/21      PT LONG TERM GOAL #3   Title Patient will demonstrate coordinated lengthening and relaxation of PFM with diaphragmatic inhalation in order to decrease spasm and allow for unrestricted elimination of urine/feces for improved overall QOL.    Baseline IE: assessed at next visit    Time 12    Period Weeks    Status New    Target Date 07/22/21      PT LONG TERM GOAL #4   Title Patient will demonstrate improved function as evidenced by a score of 55 on FOTO measure for full participation in activities at home and in the community.    Baseline IE: 44    Time 12    Period Weeks    Status New    Target Date 07/22/21      PT LONG TERM GOAL #5   Title Patient will report decreased reliance on protective undergarments as indicated by use of Poise Type 2 or 1 at frequency of 1 or none during sleeping hours to demonstrate improved bladder control and allow for increased participation in activities outside of the home.    Baseline IE: Type 3, x1    Time 12    Period Weeks    Status New    Target Date 07/22/21                   Plan - 06/02/21 1121     Clinical Impression Statement Patient presents to  clinic with excellent motivation to participate in therapy. Patient demonstrates deficits in posture, PFM coordination, PFM strength, IAP management. Patient with improved ability to voluntarily contract PFM as well as deep core during today's session and responded positively to active interventions. Patient will benefit from continued skilled therapeutic intervention to address remaining deficits in posture, PFM coordination, PFM strength, IAP management in order to increase function and improve overall QOL.  ?    Personal Factors and Comorbidities Behavior Pattern;Comorbidity 3+;Past/Current Experience;Time since onset of injury/illness/exacerbation;Age;Profession    Comorbidities anxiety, migraines, arthritis, depression, chronic LBP    Examination-Activity Limitations Continence;Sleep;Lift;Stand;Locomotion Level    Examination-Participation Restrictions Occupation;Community Activity;Laundry    Stability/Clinical Decision Making Evolving/Moderate complexity    Rehab Potential Good    PT Frequency 1x / week    PT Duration 12 weeks  PT Treatment/Interventions ADLs/Self Care Home Management;Cryotherapy;Electrical Stimulation;Moist Heat;Therapeutic activities;Therapeutic exercise;Neuromuscular re-education;Patient/family education;Orthotic Fit/Training;Manual techniques;Scar mobilization;Joint Manipulations;Spinal Manipulations;Taping    PT Home Exercise Plan bladder diary    Consulted and Agree with Plan of Care Patient             Patient will benefit from skilled therapeutic intervention in order to improve the following deficits and impairments:  Abnormal gait, Decreased coordination, Decreased endurance, Decreased strength, Increased muscle spasms, Postural dysfunction, Improper body mechanics, Pain  Visit Diagnosis: Other lack of coordination  Abnormal posture  Muscle weakness (generalized)     Problem List Patient Active Problem List   Diagnosis Date Noted   Postoperative  state 05/31/2019    Myles Gip PT, DPT [phone]  06/02/2021, 1:11 PM  [facility] [address] [place], [place], [address] Phone: [phone]   Fax:  [phone]  Name: Amy Hubbard MRN: [id number] Date of Birth: 1971-10-06

## 2021-06-03 ENCOUNTER — Encounter: Payer: Medicaid Other | Admitting: Physical Therapy

## 2021-06-09 ENCOUNTER — Ambulatory Visit: Payer: Medicaid Other | Admitting: Physical Therapy

## 2021-06-10 ENCOUNTER — Encounter: Payer: Medicaid Other | Admitting: Physical Therapy

## 2021-06-16 ENCOUNTER — Ambulatory Visit: Payer: Medicaid Other | Attending: Obstetrics and Gynecology | Admitting: Physical Therapy

## 2021-06-17 ENCOUNTER — Encounter: Payer: Medicaid Other | Admitting: Physical Therapy

## 2021-06-23 ENCOUNTER — Ambulatory Visit: Payer: Medicaid Other | Admitting: Physical Therapy

## 2021-06-30 ENCOUNTER — Ambulatory Visit: Payer: Medicaid Other | Admitting: Physical Therapy

## 2021-07-07 ENCOUNTER — Encounter: Payer: Medicaid Other | Admitting: Physical Therapy

## 2021-07-14 ENCOUNTER — Encounter: Payer: Medicaid Other | Admitting: Physical Therapy

## 2021-07-21 ENCOUNTER — Ambulatory Visit: Payer: Medicaid Other | Admitting: Obstetrics and Gynecology

## 2021-08-16 ENCOUNTER — Other Ambulatory Visit: Payer: Self-pay | Admitting: Pediatrics

## 2021-08-16 DIAGNOSIS — Z1231 Encounter for screening mammogram for malignant neoplasm of breast: Secondary | ICD-10-CM

## 2021-08-19 NOTE — Progress Notes (Signed)
[facility] New Patient Evaluation and Consultation  Referring Provider: Malachy Mood, MD PCP: Barbaraann Boys, MD Date of Service: 08/20/2021  SUBJECTIVE Chief Complaint: New Patient (Initial Visit)- urinary leakage  History of Present Illness: Amy Hubbard is a 50 y.o. White or Caucasian female seen in consultation at the request of Dr. Georgianne Fick for evaluation of incontinence.    Review of records from Dr Georgianne Fick significant for: Has urinary leakage with minimal exertion and sometimes unprovoked.  Urinary Symptoms: Leaks urine with going from sitting to standing, with movement to the bathroom, while asleep, and continuously Leaks 2-3 time(s) per day.  Pad use:  liners/ mini-pads  She is bothered by her UI symptoms. Has tried pelvic floor physical therapy and felt that it helped some.  Also tried vesicare and had no improvement.  She is set up for a sleep study in November.   Day time voids 4-6.  Nocturia: 3-4 times per night to void. Voiding dysfunction: she empties her bladder well.  does not use a catheter to empty bladder.  When urinating, she feels dribbling after finishing and the need to urinate multiple times in a row Drinks: water, occasional sweet tea or starbucks refresher once a week.    UTIs:  0  UTI's in the last year.   Denies history of blood in urine and kidney or bladder stones  Pelvic Organ Prolapse Symptoms:                  She Denies a feeling of a bulge the vaginal area.  Bowel Symptom: Bowel movements: 1 time(s) per day Stool consistency: hard, soft , or loose Straining: yes.  Splinting: no.  Incomplete evacuation: yes.  She Denies accidental bowel leakage / fecal incontinence Bowel regimen: diet and stool softener  Sexual Function Sexually active: no.   Pelvic Pain Denies pelvic pain   Past Medical History:  Past Medical History:  Diagnosis Date   Anxiety    Complication of anesthesia    PONV (postoperative  nausea and vomiting)    Uterine fibroid      Past Surgical History:   Past Surgical History:  Procedure Laterality Date   ABDOMINAL HYSTERECTOMY     BREAST BIOPSY Right 03/17/2017   fibroadenoma, bx/clip   CESAREAN SECTION N/A 10/29/2005   IUD REMOVAL     LAPAROSCOPIC VAGINAL HYSTERECTOMY WITH SALPINGECTOMY Bilateral 05/31/2019   Procedure: LAPAROSCOPIC ASSISTED VAGINAL HYSTERECTOMY WITH SALPINGECTOMY;  Surgeon: Boykin Nearing, MD;  Location: [facility] ORS;  Service: Gynecology;  Laterality: Bilateral;     Past OB/GYN History: OB History     Gravida  2   Para      Term      Preterm      AB      Living  2      SAB      IAB      Ectopic      Multiple      Live Births  2          Vaginal deliveries: 1,   Cesarean section: 1 S/p hysterectomy   Medications: She has a current medication list which includes the following prescription(s): acetaminophen, diclofenac sodium, and trospium chloride.   Allergies: Patient is allergic to sumatriptan.   Social History:  Social History   Tobacco Use   Smoking status: Never   Smokeless tobacco: Never  Vaping Use   Vaping Use: Never used  Substance Use Topics   Alcohol use: Yes  Alcohol/week: 2.0 standard drinks    Types: 2 Glasses of wine per week   Drug use: Never    Relationship status: long-term partner She is employed. Regular exercise: Yes: walks a lot at work History of abuse: No  Family History:   Family History  Problem Relation Age of Onset   Breast cancer Neg Hx      Review of Systems: Review of Systems  Constitutional:  Negative for fever, malaise/fatigue and weight loss.  Respiratory:  Negative for cough, shortness of breath and wheezing.   Cardiovascular:  Negative for chest pain, palpitations and leg swelling.  Gastrointestinal:  Negative for abdominal pain and blood in stool.  Genitourinary:  Negative for dysuria.  Musculoskeletal:  Positive for myalgias.  Skin:  Negative for  rash.  Neurological:  Positive for headaches. Negative for dizziness.  Endo/Heme/Allergies:  Does not bruise/bleed easily.       + hot flashes  Psychiatric/Behavioral:  Positive for depression. The patient is nervous/anxious.     OBJECTIVE Physical Exam: Vitals:   08/20/21 1322  BP: 115/80  Pulse: 78  Weight: 157 lb (71.2 kg)  Height: 5\' 2"  (1.575 m)    Physical Exam Constitutional:      General: She is not in acute distress. Pulmonary:     Effort: Pulmonary effort is normal.  Abdominal:     General: There is no distension.     Palpations: Abdomen is soft.     Tenderness: There is no abdominal tenderness. There is no rebound.  Musculoskeletal:        General: No swelling. Normal range of motion.  Skin:    General: Skin is warm and dry.     Findings: No rash.  Neurological:     Mental Status: She is alert and oriented to person, place, and time.  Psychiatric:        Mood and Affect: Mood normal.        Behavior: Behavior normal.     GU / Detailed Urogynecologic Evaluation:  Pelvic Exam: Normal external female genitalia; Bartholin's and Skene's glands normal in appearance; urethral meatus normal in appearance, no urethral masses or discharge.   CST: negative s/p hysterectomy: Speculum exam reveals normal vaginal mucosa with  atrophy and normal vaginal cuff.  Adnexa no mass, fullness, tenderness.    Pelvic floor strength I/V  Pelvic floor musculature: Right levator non-tender, Right obturator non-tender, Left levator non-tender, Left obturator non-tender  POP-Q:   POP-Q  -3                                            Aa   -3                                           Ba  -7.5                                              C   2  Gh  2.5                                            Pb  7.5                                            tvl   -3                                            Ap  -3                                             Bp                                                 D     Rectal Exam:  Normal external rectum  Post-Void Residual (PVR) by Bladder Scan: In order to evaluate bladder emptying, we discussed obtaining a postvoid residual and she agreed to this procedure.  Procedure: The ultrasound unit was placed on the patient's abdomen in the suprapubic region after the patient had voided. A PVR of 21 ml was obtained by bladder scan.  Laboratory Results: POC urine: trace blood  ASSESSMENT AND PLAN Amy Hubbard is a 50 y.o. with:  1. Overactive bladder    We discussed the symptoms of overactive bladder (OAB), which include urinary urgency, urinary frequency, nocturia, with or without urge incontinence.  While we do not know the exact etiology of OAB, several treatment options exist. We discussed management including behavioral therapy (decreasing bladder irritants, urge suppression strategies, timed voids, bladder retraining), physical therapy, medication; for refractory cases posterior tibial nerve stimulation, sacral neuromodulation, and intravesical botulinum toxin injection.  - She was prescribed trospium 60mg  daily. For anticholinergic medications, the potential side effects of anticholinergics including dry eyes, dry mouth, constipation, cognitive impairment and urinary retention.  She will return 6 weeks for follow up   Jaquita Folds, MD   Medical Decision Making:  - Reviewed/ ordered a clinical laboratory test - Review and summation of prior records

## 2021-08-20 ENCOUNTER — Encounter: Payer: Self-pay | Admitting: Obstetrics and Gynecology

## 2021-08-20 ENCOUNTER — Other Ambulatory Visit: Payer: Self-pay

## 2021-08-20 ENCOUNTER — Ambulatory Visit (INDEPENDENT_AMBULATORY_CARE_PROVIDER_SITE_OTHER): Payer: Medicaid Other | Admitting: Obstetrics and Gynecology

## 2021-08-20 VITALS — BP 115/80 | HR 78 | Ht 62.0 in | Wt 157.0 lb

## 2021-08-20 DIAGNOSIS — N3281 Overactive bladder: Secondary | ICD-10-CM

## 2021-08-20 DIAGNOSIS — R350 Frequency of micturition: Secondary | ICD-10-CM

## 2021-08-20 LAB — POCT URINALYSIS DIPSTICK
Appearance: NORMAL
Bilirubin, UA: NEGATIVE
Glucose, UA: NEGATIVE
Ketones, UA: NEGATIVE
Leukocytes, UA: NEGATIVE
Nitrite, UA: NEGATIVE
Protein, UA: NEGATIVE
Spec Grav, UA: 1.015 (ref 1.010–1.025)
Urobilinogen, UA: 1 E.U./dL
pH, UA: 5.5 (ref 5.0–8.0)

## 2021-08-20 MED ORDER — TROSPIUM CHLORIDE ER 60 MG PO CP24: 1.0000 | ORAL_CAPSULE | Freq: Every day | ORAL | 5 refills | Status: DC

## 2021-08-27 ENCOUNTER — Encounter: Payer: Self-pay | Admitting: Obstetrics and Gynecology

## 2021-08-27 DIAGNOSIS — N3281 Overactive bladder: Secondary | ICD-10-CM

## 2021-09-02 MED ORDER — OXYBUTYNIN CHLORIDE ER 5 MG PO TB24: 5.0000 mg | ORAL_TABLET | Freq: Every day | ORAL | 5 refills | Status: DC

## 2021-09-07 ENCOUNTER — Ambulatory Visit: Payer: Medicaid Other

## 2021-10-01 ENCOUNTER — Ambulatory Visit: Payer: Medicaid Other | Admitting: Obstetrics and Gynecology

## 2021-10-14 ENCOUNTER — Ambulatory Visit: Payer: Medicaid Other | Admitting: Obstetrics and Gynecology

## 2021-12-22 ENCOUNTER — Ambulatory Visit: Payer: Medicaid Other | Admitting: Obstetrics and Gynecology

## 2021-12-23 ENCOUNTER — Other Ambulatory Visit: Payer: Self-pay

## 2021-12-23 ENCOUNTER — Encounter: Payer: Self-pay | Admitting: Obstetrics and Gynecology

## 2021-12-23 ENCOUNTER — Ambulatory Visit (INDEPENDENT_AMBULATORY_CARE_PROVIDER_SITE_OTHER): Payer: Medicaid Other | Admitting: Obstetrics and Gynecology

## 2021-12-23 VITALS — BP 117/62 | HR 66

## 2021-12-23 DIAGNOSIS — N3281 Overactive bladder: Secondary | ICD-10-CM | POA: Diagnosis not present

## 2021-12-23 MED ORDER — OXYBUTYNIN CHLORIDE ER 10 MG PO TB24: 10.0000 mg | ORAL_TABLET | Freq: Every day | ORAL | 5 refills | Status: AC

## 2021-12-23 NOTE — Progress Notes (Signed)
[name] Return Visit  SUBJECTIVE  History of Present Illness: [facility] is a 51 y.o. female seen in follow-up for overactive bladder. Plan at last visit was to start oxybutynin 5mg  ER (trospium was ordered but was too expensive).   Many times, pad is dry. Not having large volume leakage anymore. Still leaking some here and there. Denies side effects of dry mouth or dry eyes, sometimes has constipation but that has been a chronic issue.    Past Medical History: Patient  has a past medical history of Anxiety, Complication of anesthesia, PONV (postoperative nausea and vomiting), and Uterine fibroid.   Past Surgical History: She  has a past surgical history that includes IUD removal; Laparoscopic vaginal hysterectomy with salpingectomy (Bilateral, 05/31/2019); Abdominal hysterectomy; Breast biopsy (Right, 03/17/2017); and Cesarean section (N/A, 10/29/2005).   Medications: She has a current medication list which includes the following prescription(s): acetaminophen, diclofenac sodium, and oxybutynin.   Allergies: Patient has no active allergies.   Social History: Patient  reports that she has never smoked. She has never used smokeless tobacco. She reports current alcohol use of about 2.0 standard drinks per week. She reports that she does not use drugs.      OBJECTIVE     Physical Exam: Vitals:   12/23/21 0814  BP: 117/62  Pulse: 66   Gen: No apparent distress, A&O x 3.  Detailed Urogynecologic Evaluation:  Deferred.    ASSESSMENT AND PLAN    [name] is a 51 y.o. with:  1. Overactive bladder    - Has seen some improvement but still having leakage. Will increase oxybutynin dose to 10mg  ER daily. New Rx sent to pharmacy - We reviewed long term side effects of medication.   Return 2 months or sooner if needed  Jaquita Folds, MD

## 2022-01-25 ENCOUNTER — Emergency Department
Admission: EM | Admit: 2022-01-25 | Discharge: 2022-01-25 | Disposition: A | Discharge status: Home / Self Care | Payer: Medicaid Other | Attending: Emergency Medicine | Admitting: Emergency Medicine

## 2022-01-25 ENCOUNTER — Other Ambulatory Visit: Payer: Self-pay

## 2022-01-25 ENCOUNTER — Emergency Department: Payer: Medicaid Other

## 2022-01-25 DIAGNOSIS — R110 Nausea: Secondary | ICD-10-CM | POA: Insufficient documentation

## 2022-01-25 DIAGNOSIS — R1031 Right lower quadrant pain: Secondary | ICD-10-CM | POA: Diagnosis present

## 2022-01-25 DIAGNOSIS — K5900 Constipation, unspecified: Secondary | ICD-10-CM | POA: Insufficient documentation

## 2022-01-25 LAB — COMPREHENSIVE METABOLIC PANEL
ALT: 16 U/L (ref 0–44)
AST: 19 U/L (ref 15–41)
Albumin: 3.8 g/dL (ref 3.5–5.0)
Alkaline Phosphatase: 68 U/L (ref 38–126)
Anion gap: 6 (ref 5–15)
BUN: 16 mg/dL (ref 6–20)
CO2: 26 mmol/L (ref 22–32)
Calcium: 9.3 mg/dL (ref 8.9–10.3)
Chloride: 106 mmol/L (ref 98–111)
Creatinine, Ser: 0.92 mg/dL (ref 0.44–1.00)
GFR, Estimated: >60 mL/min (ref >60)
Glucose, Bld: 97 mg/dL (ref 70–99)
Potassium: 3.9 mmol/L (ref 3.5–5.1)
Sodium: 138 mmol/L (ref 135–145)
Total Bilirubin: 0.7 mg/dL (ref 0.3–1.2)
Total Protein: 7.2 g/dL (ref 6.5–8.1)

## 2022-01-25 LAB — URINALYSIS, ROUTINE W REFLEX MICROSCOPIC
Bilirubin Urine: NEGATIVE
Glucose, UA: NEGATIVE mg/dL
Hgb urine dipstick: NEGATIVE
Ketones, ur: NEGATIVE mg/dL
Leukocytes,Ua: NEGATIVE
Nitrite: NEGATIVE
Protein, ur: NEGATIVE mg/dL
Specific Gravity, Urine: 1.025 (ref 1.005–1.030)
pH: 6 (ref 5.0–8.0)

## 2022-01-25 LAB — CBC
HCT: 44.1 % (ref 36.0–46.0)
Hemoglobin: 14.5 g/dL (ref 12.0–15.0)
MCH: 31.2 pg (ref 26.0–34.0)
MCHC: 32.9 g/dL (ref 30.0–36.0)
MCV: 94.8 fL (ref 80.0–100.0)
Platelets: 337 10*3/uL (ref 150–400)
RBC: 4.65 MIL/uL (ref 3.87–5.11)
RDW: 12.4 % (ref 11.5–15.5)
WBC: 10.2 10*3/uL (ref 4.0–10.5)
nRBC: 0 % (ref 0.0–0.2)

## 2022-01-25 LAB — LIPASE, BLOOD: Lipase: 37 U/L (ref 11–51)

## 2022-01-25 MED ORDER — SODIUM CHLORIDE 0.9 % IV BOLUS
1000.0000 mL | Freq: Once | INTRAVENOUS | Status: AC
  Administered 2022-01-25: 1000 mL via INTRAVENOUS

## 2022-01-25 MED ORDER — IOHEXOL 300 MG/ML  SOLN
100.0000 mL | Freq: Once | INTRAMUSCULAR | Status: AC | PRN
  Administered 2022-01-25: 100 mL via INTRAVENOUS

## 2022-01-25 MED ORDER — ONDANSETRON HCL 4 MG/2ML IJ SOLN
4.0000 mg | Freq: Once | INTRAMUSCULAR | Status: AC
  Administered 2022-01-25: 4 mg via INTRAVENOUS
  Filled 2022-01-25: qty 2

## 2022-01-25 MED ORDER — SENNA 8.6 MG PO TABS: 1.0000 | ORAL_TABLET | Freq: Three times a day (TID) | ORAL | 0 refills | Status: AC | PRN

## 2022-01-25 MED ORDER — MORPHINE SULFATE (PF) 4 MG/ML IV SOLN
4.0000 mg | Freq: Once | INTRAVENOUS | Status: AC
  Administered 2022-01-25: 4 mg via INTRAVENOUS
  Filled 2022-01-25: qty 1

## 2022-01-25 NOTE — ED Triage Notes (Signed)
Pt c/o waking with RLQ pain with nausea, denies vomiting or diarrhea. Pt is moaning and is unable to stand up straight due to the pain ?

## 2022-01-26 NOTE — ED Provider Notes (Addendum)
? ?[facility] ?Provider Note ? ? Event Date/Time  ? First MD Initiated Contact with Patient 01/25/22 [phone]   ?  (approximate) ?History  ?Abdominal Pain ? ?HPI ?Amy [facility] is a 51 y.o. female who presents for right lower quadrant abdominal pain that has been worsening over the last 24 hours and is described as an aching, nonradiating, 10/10 pain.  Patient states that this pain is unlike anything she has ever experienced in the past.  Patient endorses associated nausea without vomiting or diarrhea.  Patient states that has been 3 days since her last bowel movement ?Physical Exam  ?Triage Vital Signs: ?ED Triage Vitals  ?Enc Vitals Group  ?   BP 01/25/22 0925 123/77  ?   Pulse Rate 01/25/22 0925 64  ?   Resp 01/25/22 0925 20  ?   Temp 01/25/22 0925 98.7 ?F (37.1 ?C)  ?   Temp Source 01/25/22 0925 Oral  ?   SpO2 01/25/22 0925 100 %  ?   Weight --   ?   Height --   ?   Head Circumference --   ?   Peak Flow --   ?   Pain Score 01/25/22 1034 3  ?   Pain Loc --   ?   Pain Edu? --   ?   Excl. in [place]? --   ? ?Most recent vital signs: ?Vitals:  ? 01/25/22 1151 01/25/22 1206  ?BP: 130/79 135/74  ?Pulse: 85 85  ?Resp: 17 17  ?Temp: 98.5 ?F (36.9 ?C)   ?SpO2: 98% 98%  ? ?General: Awake, oriented x4. ?CV:  Good peripheral perfusion.  ?Resp:  Normal effort.  ?Abd:  No distention.  Mild right lower quadrant tenderness to palpation ?Other:  Middle-aged Caucasian overweight female laying in bed in no distress ?ED Results / Procedures / Treatments  ?Labs ?(all labs ordered are listed, but only abnormal results are displayed) ?Labs Reviewed  ?URINALYSIS, ROUTINE W REFLEX MICROSCOPIC - Abnormal; Notable for the following components:  ?    Result Value  ? Color, Urine YELLOW (*)   ? APPearance CLEAR (*)   ? All other components within normal limits  ?LIPASE, BLOOD  ?COMPREHENSIVE METABOLIC PANEL  ?CBC  ? ?RADIOLOGY ?ED MD interpretation: CT of the abdomen and pelvis with IV contrast as interpreted by me shows  no evidence of acute abnormalities in the abdomen/pelvis.  Patient does however have moderate stool burden throughout colon ?-Agree with radiology assessment ?Official radiology report(s): ?No results found. ?PROCEDURES: ?Critical Care performed: No ?.1-3 Lead EKG Interpretation ?Performed by: Naaman Plummer, MD ?Authorized by: Naaman Plummer, MD  ? ?  Interpretation: normal   ?  ECG rate:  82 ?  ECG rate assessment: normal   ?  Rhythm: sinus rhythm   ?  Ectopy: none   ?  Conduction: normal   ?MEDICATIONS ORDERED IN ED: ?Medications  ?sodium chloride 0.9 % bolus 1,000 mL (0 mLs Intravenous Stopped 01/25/22 1143)  ?ondansetron [facility]) injection 4 mg (4 mg Intravenous Given 01/25/22 0958)  ?morphine (PF) 4 MG/ML injection 4 mg (4 mg Intravenous Given 01/25/22 0959)  ?iohexol (OMNIPAQUE) 300 MG/ML solution 100 mL (100 mLs Intravenous Contrast Given 01/25/22 1029)  ? ?IMPRESSION / MDM / ASSESSMENT AND PLAN / ED COURSE  ?I reviewed the triage vital signs and the nursing notes. ?             ?               ?  The patient is on the cardiac monitor to evaluate for evidence of arrhythmia and/or significant heart rate changes. ? ?Patient?s history and exam most consistent with constipation as an etiology for their pain. ? ?Patient?s symptoms not typical for other emergent causes of abdominal pain such as, but not limited to, appendicitis, abdominal aortic aneurysm, pancreatitis, SBO, mesenteric ischemia, serious intra-abdominal bacterial illness. ? ?Patient without red flags concerning for cancer as a constipation etiology. ? ?Rx: Miralax ? ?Disposition:  ?Patient will be discharged with strict return precautions and follow up with primary MD within 24-48 hours for further evaluation. ?Patient understands that this still may have an early presentation of an emergent medical condition such as appendicitis that will require a recheck. ?  ?FINAL CLINICAL IMPRESSION(S) / ED DIAGNOSES  ? ?Final diagnoses:  ?Right lower quadrant  abdominal pain  ?Constipation, unspecified constipation type  ? ?Rx / DC Orders  ? ?ED Discharge Orders   ? ?      Ordered  ?  senna (SENOKOT) 8.6 MG TABS tablet  3 times daily PRN       ? 01/25/22 1105  ? ?  ?  ? ?  ? ?Note:  This document was prepared using Dragon voice recognition software and may include unintentional dictation errors. ?  ?Naaman Plummer, MD ?01/26/22 1436 ? ?  ?Naaman Plummer, MD ?01/26/22 1438 ? ?

## 2022-02-01 ENCOUNTER — Other Ambulatory Visit: Payer: Self-pay

## 2022-02-01 ENCOUNTER — Ambulatory Visit
Admission: RE | Admit: 2022-02-01 | Discharge: 2022-02-01 | Disposition: A | Discharge status: Home / Self Care | Payer: Medicaid Other | Source: Ambulatory Visit | Attending: Pediatrics | Admitting: Pediatrics

## 2022-02-01 DIAGNOSIS — Z1231 Encounter for screening mammogram for malignant neoplasm of breast: Secondary | ICD-10-CM | POA: Insufficient documentation

## 2022-02-23 ENCOUNTER — Ambulatory Visit: Payer: Medicaid Other | Admitting: Obstetrics and Gynecology

## 2022-03-10 ENCOUNTER — Ambulatory Visit: Payer: Medicaid Other | Admitting: Obstetrics and Gynecology

## 2022-04-12 ENCOUNTER — Ambulatory Visit: Payer: Medicaid Other | Admitting: Physical Therapy

## 2022-04-13 ENCOUNTER — Ambulatory Visit: Payer: Medicaid Other | Attending: Pediatrics | Admitting: Physical Therapy

## 2022-04-13 ENCOUNTER — Encounter: Payer: Self-pay | Admitting: Physical Therapy

## 2022-04-13 DIAGNOSIS — R293 Abnormal posture: Secondary | ICD-10-CM | POA: Insufficient documentation

## 2022-04-13 DIAGNOSIS — M6281 Muscle weakness (generalized): Secondary | ICD-10-CM | POA: Diagnosis present

## 2022-04-13 DIAGNOSIS — R278 Other lack of coordination: Secondary | ICD-10-CM | POA: Diagnosis present

## 2022-04-13 NOTE — Therapy (Signed)
OUTPATIENT PHYSICAL THERAPY FEMALE PELVIC EVALUATION   Patient Name: Amy [facility] MRN: [id number] DOB:Oct 20, 1971, 51 y.o., female Today's Date: 04/13/2022   PT End of Session - 04/13/22 1323     Visit Number 1    Number of Visits 8    Date for PT Re-Evaluation 06/08/22    Authorization Type IE 04/13/2022    PT Start Time 1330    PT Stop Time 1410    PT Time Calculation (min) 40 min    Activity Tolerance Patient tolerated treatment well    Behavior During Therapy [facility] for tasks assessed/performed             Past Medical History:  Diagnosis Date   Anxiety    Complication of anesthesia    PONV (postoperative nausea and vomiting)    Uterine fibroid    Past Surgical History:  Procedure Laterality Date   ABDOMINAL HYSTERECTOMY     BREAST BIOPSY Right 03/17/2017   fibroadenoma, bx/clip   CESAREAN SECTION N/A 10/29/2005   IUD REMOVAL     LAPAROSCOPIC VAGINAL HYSTERECTOMY WITH SALPINGECTOMY Bilateral 05/31/2019   Procedure: LAPAROSCOPIC ASSISTED VAGINAL HYSTERECTOMY WITH SALPINGECTOMY;  Surgeon: Boykin Nearing, MD;  Location: [facility] ORS;  Service: Gynecology;  Laterality: Bilateral;   Patient Active Problem List   Diagnosis Date Noted   Postoperative state 05/31/2019    PCP: Barbaraann Boys, MD  REFERRING PROVIDER: Barbaraann Boys, MD  REFERRING DIAG: [phone] (ICD-10-CM) - Overflow incontinence M54.2 (ICD-10-CM) - Cervicalgia G89.29 (ICD-10-CM) - Other chronic pain M53.3 (ICD-10-CM) - Sacrococcygeal disorders, not elsewhere classified   THERAPY DIAG:  Other lack of coordination  Muscle weakness (generalized)  Abnormal posture  Rationale for Evaluation and Treatment Rehabilitation  PRECAUTIONS: None  WEIGHT BEARING RESTRICTIONS No  FALLS:  Has patient fallen in last 6 months?  No falls; but has progressive balance concerns. Being followed by neurology at [facility].  ONSET DATE: 2021  SUBJECTIVE:                                                                                                                                                                                            CHIEF COMPLAINT: Patient continues to deal with SIJ (R>L) pain which has no alleviating factors; recently increased gabapentin dosage to help manage, but patient has not seen any significant improvement to date. Patient notes that she has complaints from neck pain to LBP and then pelvic concerns. Patient did have some relief in oxybutynin initially but notes no continued relief. Patient notes that UI has changed since last year; patient notes that she deals with some urge incontinence, but has no longer  been dealing with complete loss of bladder control. Patient is awaiting new MRI from neurology as well as EEG (July). Patient notes that she has also been dealing with UI with no apparent trigger. Patient notes that she feels a near constant urge to empty bladder.     PERTINENT HISTORY/CHART REVIEW:  Red flags (bowel/bladder changes, saddle paresthesia, personal history of cancer, h/o spinal tumors, h/o compression fx, h/o abdominal aneurysm, abdominal pain, chills/fever, night sweats, nausea, vomiting, unrelenting pain, first onset of insidious LBP <20 y/o): Negative  "-Urinary incontinence. Started in Dec 2021, was picking something up and felt back pain, then she wet herself. Has had urinary incontinence since then.  -Feels urgency, will try to urinate but will not get a lot of volume out. When standing up from toilet, will dribble. Will also dribble throughout the day. By end of the day, her pad is semi-wet.  -Has also had a few episodes of large-volume urinary incontinence that "come out of nowhere." One happened while at work in 2022 (at [facility]) so she went to the ED, bladder U/S showed high post-void residual volume. No structural anomalies/obstruction.  -Some leakage with coughing/sneezing, no leakage when straining to have BM (has history of constipation)   -Symptoms had been constant since onset in 2021 but recently began to get worse. More frequent episodes of incontinence with increased volume.  - No dysuria, hematuria, abdominal pain -Follows with Urogyn in [place], looking to see someone closer. Work-up in the past has been negative for bladder prolapse. Currently on 10 mg oxybutynin for the past year, has not been helping.  - Tried pelvic floor PT in 2022, 3-4 visits, helped some.  - Hx of 1 vaginal delivery  -Has been having tremors, numbness/tingling in hands. Following with neurology, currently undergoing work-up for multiple sclerosis  Overflow incontinence of urine Suboptimal treatment. 1.5 years of urinary incontinence, constant dribbling with occasional episodes of large-volume incontinence. Her symptoms are most consistent with neurogenic bladder, especially because the large volume incontinence occurs suddenly without warning. Of note, patient is currently undergoing work-up for multiple sclerosis, which could explain onset of neurogenic bladder. Though she endorses some urgency, her symptoms have not improved with 10 mg oxybutynin, making urge incontinence less likely. Additionally, her incontinence episodes are typically not related to coughing/sneezing/etc., making stress incontinence less probable. UA reassuring against UTI. We will plan to treat empirically for neurogenic bladder while awaiting the results of her MS work-up.  [facility] POC Urinalysis Chemical; Future - [facility] POC Urinalysis Chemical - doxazosin (CARDURA) 2 MG tablet; Take 2 tablets (4 mg total) by mouth at bedtime - Ambulatory Referral to UrogynecologyJanene Harvey, MD note 03/31/2022   PAIN:  Are you having pain? Yes NPRS scale:  8/10 (current) 7/10 (least) 10/10 (worst) Pain location: B SIJ Pain type: constant Pain descriptors: aching, burning, stiff  (Notes stabbing pain at R SIJ has subsided) Pain duration:   Aggravating factors: activity (walking 10-15 minutes  will make patient tearful with pain) Relieving factors: nothing known   PLOF:  Independent  PATIENT GOALS: "Get me to the point where I feel stronger in my pelvic area and I can stand up without feeling like my whole body wants to turn into a massive knot of pain"  OBSTETRICAL HISTORY: G2P2 Deliveries: SVD, c-section Tearing/Episiotomy:  Birthing position:  GYNECOLOGICAL HISTORY: Hysterectomy: Yes Laparascopic Endometriosis: Negative Last Menstrual Period:  Pain with exam: No  Prolapse: None Heaviness/pressure: No   UROLOGICAL HISTORY: Frequency of urination: every 2  hours Incontinence: Urge to void, Walking to the bathroom, and no clear reason Amount: Min/Mod Protective undergarments: Yes   Type: #3 pad ; Number used/day: 2-3 Fluid Intake: 3-4 x 16.9 oz H20, no caffeinated, occasional sodas Nocturia: 2x/night Toileting posture: feet flat Incomplete emptying: Yes  Pain with urination: Negative Stream: Strong Urgency: Yes  Difficulty initiating urination: Positive for 50% Intermittent stream: Positive for 25% Frequent UTI: Negative.   GASTROINTESTINAL HISTORY: Type of bowel movement: (Bristol Stool Scale) 4 (currently with history of 1/2) Frequency of BMs: 3-4x/week; previously 1x/week Incomplete bowel movement: No (occasional sensation of incomplete emptying) Pain with defecation: Negative Straining with defecation: Positive for history; past two to three weeks no straining. Toileting posture: figure 4 on occasion Fiber supplement: Yes occasional (senna, dulcolax). Incontinence: Negative.    SEXUAL HISTORY AND FUNCTION: Sexually active: No    OBJECTIVE:  DIAGNOSTIC TESTING/IMAGING: Awaiting imaging from neurology. No urodynamics testing or cystoscopy to date. PVR last measured 08/2021 21 mL.   COGNITION:  Patient is oriented to person, place, and time.  Recent memory is intact.  Remote memory is intact.  Attention span and concentration are intact.   Expressive speech is intact.  Patient's fund of knowledge is within normal limits for educational level.    POSTURE/OBSERVATIONS:   Lumbar lordosis: diminished Thoracic kyphosis: increased Iliac crest height: not formally assessed  Lumbar lateral shift: not formally assessed  Pelvic obliquity: not formally assessed  Leg length discrepancy: not formally assessed    GAIT:  Assistive device utilized: None Level of assistance: Complete Independence Trendelenburg R: Positive L: Positive    RANGE OF MOTION: deferred 2/2 to time constraints  AROM (Normal range in degrees) AROM  04/13/2022  Lumbar   Flexion (65)   Extension (30)   Right lateral flexion (25)   Left lateral flexion (25)   Right rotation (30)   Left rotation (30)       Hip LEFT RIGHT  Flexion (125)    Extension (15)    Abduction (40)    Adduction     Internal Rotation (45)    External Rotation (45)    (* = pain; blank rows = not tested)   SENSATION: deferred 2/2 to time constraints  Grossly intact to light touch bilateral LEs as determined by testing dermatomes L2-S2 Proprioception and hot/cold testing deferred on this date   STRENGTH: MMT deferred 2/2 to time constraints   RLE LLE  Hip Flexion    Hip Extension    Hip Abduction     Hip Adduction     Hip ER     Hip IR     Knee Extension    Knee Flexion    Dorsiflexion     Plantarflexion (seated)    (* = pain; blank rows = not tested)   MUSCLE LENGTH: deferred 2/2 to time constraints  Hamstrings: Ely (quadriceps): Thomas (hip flexors): Ober:   ABDOMINAL: deferred 2/2 to time constraints  Palpation: Diastasis: Scar mobility: Rib flare:   SPECIAL TESTS: deferred 2/2 to time constraints   PALPATION: deferred 2/2 to time constraints  LOCATION LEFT  RIGHT           Lumbar paraspinals    Quadratus Lumborum    Gluteus Maximus    Gluteus Medius    Deep hip external rotators    PSIS    Fortin's Area (SIJ)    Greater Trochanter     ASIS    Sacral border    Coccyx  Ischial tuberosity    (blank rows = not tested) Graded on 0-4 scale (0 = no pain, 1 = pain, 2 = pain with wincing/grimacing/flinching, 3 = pain with withdrawal, 4 = unwilling to allow palpation)   PHYSICAL PERFORMANCE MEASURES:  STS: antalgic and forward flexed posture, increased time to achieve standing Deep Squat: RLE STS: LLE STS:  6MWT: 5TSTS:     EXTERNAL PELVIC EXAM: deferred 2/2 to time constraints None given; testing deferred to later date  Breath coordination: Voluntary Contraction: present/absent Relaxation: full/delayed/non-relaxing Perineal movement with sustained IAP increase ("bear down"): descent/no change/elevation/excessive descent Perineal movement with rapid IAP increase ("cough"): elevation/no change/descent Palpation of bulbocavernosus: Palpation of ischiocavernosus: Palpation of pubic symphysis: Palpation of superficial transverse perineal:   INTERNAL VAGINAL EXAM: deferred 2/2 to time constraints None given; testing deferred to later date  Introitus Appears:  Skin integrity:  Scar mobility: Strength (PERF):  Symmetry: Palpation: Prolapse: (0 no contraction, 1 flicker, 2 weak squeeze and no lift, 3 fair squeeze and definite lift, 4 good squeeze and lift against resistance, 5 strong squeeze against strong resistance)   RECTAL EXAM: not indicated None given; testing deferred to later date  Anal wink: present/absent Symmetry: Palpation: Strength (PERF):  (0 no contraction, 1 flicker, 2 weak squeeze and no lift, 3 fair squeeze and definite lift, 4 good squeeze and lift against resistance, 5 strong squeeze against strong resistance)   PATIENT EDUCATION:  Patient educated on prognosis, POC, and provided with HEP including: bladder irritants, tips for complete emptying handouts. Patient articulated understanding and returned demonstration. Patient will benefit from further education in order to maximize  compliance and understanding for long-term therapeutic gains.   PATIENT SURVEYS:  FOTO Urinary Problem 52, Bowel Constipation 45, PFDI Pain 100  ASSESSMENT:  Clinical Impression: Patient is a 51 year old presenting to clinic with chief complaints of urinary incontinence as well as persistent spine pain. Today's evaluation is suggestive of deficits in posture, PFM strength, PFM coordination, bladder habits, and pain as evidenced by 10/10 worst SIJ pain, difficulty achieving erect posture with STS, UI with urge, stress, and suspicion for neurogenic factors. Patient's responses on FOTO outcome measures (PFDI Pain 100, Urinary Problem 52, Bowel Constipation 45 ) indicate severe functional limitations/disability/distress. Patient's progress may be limited due to persistence of complaints as well as continued neurological work-up; however, patient's motivation is advantageous. Patient was able to achieve basic understanding of bladder irritants and strategies for complete emptying during today's evaluation and responded positively to educational interventions. Patient will benefit from continued skilled therapeutic intervention to address deficits in posture, PFM strength, PFM coordination, bladder habits, and pain in order to increase function and improve overall QOL.   Objective impairments: decreased activity tolerance, decreased coordination, decreased endurance, difficulty walking, decreased strength, improper body mechanics, postural dysfunction, and pain.   Activity limitations: cleaning, laundry, community activity, and occupation.   Personal factors: Behavior pattern, Past/current experiences, Time since onset of injury/illness/exacerbation, and 3+ comorbidities: anxiety, chronic migraine, history of HTN, spondylosis of lumbar region  are also affecting patient's functional outcome.   Rehab Potential: Fair    Clinical decision making: Evolving/moderate complexity  Evaluation complexity:  Moderate   GOALS: Goals reviewed with patient? Yes  LONG TERM GOALS: Target date: 06/08/2022  Patient will demonstrate improved function as evidenced by a score of > 55 on FOTO Urinary Problem measure for full participation in activities at home and in the community.  Baseline: 52 Goal status: INITIAL  Patient will demonstrate independence with HEP  in order to maximize therapeutic gains and improve carryover from physical therapy sessions to ADLs in the home and community. Baseline: not initiated Goal status: INITIAL  Patient will demonstrate improved function as evidenced by a score of <60 on FOTO PFDI Pain measure for full participation in activities at home and in the community. Baseline: 100 Goal status: INITIAL  Patient will demonstrate improved function as evidenced by a score of >49 on FOTO Bowel Constipation measure for full participation in activities at home and in the community. Baseline: 45 Goal status: INITIAL   PLAN: Rehab frequency: 1x/week  Rehab duration: 8 weeks  Planned interventions: Therapeutic exercises, Therapeutic activity, Neuromuscular re-education, Balance training, Gait training, Patient/Family education, Joint mobilization, Orthotic/Fit training, Electrical stimulation, Spinal mobilization, Cryotherapy, Moist heat, scar mobilization, Taping, and Manual therapy     Myles Gip PT, DPT [phone]  04/13/2022, 2:44 PM

## 2022-04-15 ENCOUNTER — Other Ambulatory Visit: Payer: Self-pay | Admitting: Neurology

## 2022-04-15 DIAGNOSIS — R251 Tremor, unspecified: Secondary | ICD-10-CM

## 2022-04-19 ENCOUNTER — Ambulatory Visit: Payer: Medicaid Other | Attending: Pediatrics | Admitting: Physical Therapy

## 2022-04-19 ENCOUNTER — Encounter: Payer: Self-pay | Admitting: Physical Therapy

## 2022-04-19 DIAGNOSIS — M6281 Muscle weakness (generalized): Secondary | ICD-10-CM | POA: Insufficient documentation

## 2022-04-19 DIAGNOSIS — R278 Other lack of coordination: Secondary | ICD-10-CM | POA: Diagnosis present

## 2022-04-19 DIAGNOSIS — R293 Abnormal posture: Secondary | ICD-10-CM | POA: Insufficient documentation

## 2022-04-19 NOTE — Patient Instructions (Signed)
Access Code: [email or web address] URL: [email or web address] Date: 04/19/2022 Prepared by: Myles Gip  Exercises - Single Knee to Chest Stretch  - 1 x daily - 7 x weekly - 3 sets - 5-15 breath hold - Supine Hip External Rotation Stretch  - 3 sets - 5-15 breath hold - Supine Shoulder Flexion Extension AAROM with Dowel  - 3 sets - 5-15 breath hold

## 2022-04-19 NOTE — Therapy (Signed)
OUTPATIENT PHYSICAL THERAPY FEMALE PELVIC TREATMENT   Patient Name: Amy Hubbard MRN: [id number] DOB:13-Jan-1971, 51 y.o., female Today's Date: 04/19/2022   PT End of Session - 04/19/22 1022     Visit Number 2    Number of Visits 8    Date for PT Re-Evaluation 06/08/22    Authorization Type IE 04/13/2022    PT Start Time 1025    PT Stop Time 1105    PT Time Calculation (min) 40 min    Activity Tolerance Patient tolerated treatment well    Behavior During Therapy [facility] for tasks assessed/performed             Past Medical History:  Diagnosis Date   Anxiety    Complication of anesthesia    PONV (postoperative nausea and vomiting)    Uterine fibroid    Past Surgical History:  Procedure Laterality Date   ABDOMINAL HYSTERECTOMY     BREAST BIOPSY Right 03/17/2017   fibroadenoma, bx/clip   CESAREAN SECTION N/A 10/29/2005   IUD REMOVAL     LAPAROSCOPIC VAGINAL HYSTERECTOMY WITH SALPINGECTOMY Bilateral 05/31/2019   Procedure: LAPAROSCOPIC ASSISTED VAGINAL HYSTERECTOMY WITH SALPINGECTOMY;  Surgeon: Boykin Nearing, MD;  Location: [facility] ORS;  Service: Gynecology;  Laterality: Bilateral;   Patient Active Problem List   Diagnosis Date Noted   Postoperative state 05/31/2019    PCP: Barbaraann Boys, MD  REFERRING PROVIDER: Barbaraann Boys, MD  REFERRING DIAG: [phone] (ICD-10-CM) - Overflow incontinence M54.2 (ICD-10-CM) - Cervicalgia G89.29 (ICD-10-CM) - Other chronic pain M53.3 (ICD-10-CM) - Sacrococcygeal disorders, not elsewhere classified   THERAPY DIAG:  Other lack of coordination  Muscle weakness (generalized)  Abnormal posture  Rationale for Evaluation and Treatment Rehabilitation  PRECAUTIONS: None  WEIGHT BEARING RESTRICTIONS No  FALLS:  Has patient fallen in last 6 months?  No falls; but has progressive balance concerns. Being followed by neurology at [facility].  ONSET DATE: 2021  SUBJECTIVE: Patient notes that her dizziness has kicked back  in with new headache (Sunday). Denies any tremors. Reports the R SIJ/hip pain is not too bad today.     PERTINENT HISTORY/CHART REVIEW:  Red flags (bowel/bladder changes, saddle paresthesia, personal history of cancer, h/o spinal tumors, h/o compression fx, h/o abdominal aneurysm, abdominal pain, chills/fever, night sweats, nausea, vomiting, unrelenting pain, first onset of insidious LBP <20 y/o): Negative  "-Urinary incontinence. Started in Dec 2021, was picking something up and felt back pain, then she wet herself. Has had urinary incontinence since then.  -Feels urgency, will try to urinate but will not get a lot of volume out. When standing up from toilet, will dribble. Will also dribble throughout the day. By end of the day, her pad is semi-wet.  -Has also had a few episodes of large-volume urinary incontinence that "come out of nowhere." One happened while at work in 2022 (at [employer]) so she went to the ED, bladder U/S showed high post-void residual volume. No structural anomalies/obstruction.  -Some leakage with coughing/sneezing, no leakage when straining to have BM (has history of constipation)  -Symptoms had been constant since onset in 2021 but recently began to get worse. More frequent episodes of incontinence with increased volume.  - No dysuria, hematuria, abdominal pain -Follows with Urogyn in [place], looking to see someone closer. Work-up in the past has been negative for bladder prolapse. Currently on 10 mg oxybutynin for the past year, has not been helping.  - Tried pelvic floor PT in 2022, 3-4 visits, helped some.  -  Hx of 1 vaginal delivery  -Has been having tremors, numbness/tingling in hands. Following with neurology, currently undergoing work-up for multiple sclerosis  Overflow incontinence of urine Suboptimal treatment. 1.5 years of urinary incontinence, constant dribbling with occasional episodes of large-volume incontinence. Her symptoms are most consistent with  neurogenic bladder, especially because the large volume incontinence occurs suddenly without warning. Of note, patient is currently undergoing work-up for multiple sclerosis, which could explain onset of neurogenic bladder. Though she endorses some urgency, her symptoms have not improved with 10 mg oxybutynin, making urge incontinence less likely. Additionally, her incontinence episodes are typically not related to coughing/sneezing/etc., making stress incontinence less probable. UA reassuring against UTI. We will plan to treat empirically for neurogenic bladder while awaiting the results of her MS work-up.  [facility] POC Urinalysis Chemical; Future - [facility] POC Urinalysis Chemical - doxazosin (CARDURA) 2 MG tablet; Take 2 tablets (4 mg total) by mouth at bedtime - Ambulatory Referral to UrogynecologyJanene Harvey, MD note 03/31/2022   PAIN:  Are you having pain? Yes NPRS scale:  5-6/10 (current)  Pain location: B SIJ Pain type: constant Pain descriptors:  stiff and sore  PLOF:  Independent  PATIENT GOALS: "Get me to the point where I feel stronger in my pelvic area and I can stand up without feeling like my whole body wants to turn into a massive knot of pain"   OBJECTIVE:  04/19/2022:  RANGE OF MOTION:   AROM (Normal range in degrees) AROM  04/19/2022  Lumbar   Flexion (65) WFL (stiff stretch)  Extension (30) Negligible*  Right lateral flexion (25) ~10 *  Left lateral flexion (25) limited  Right rotation (30) Negligible*  Left rotation (30) limited      Hip LEFT RIGHT  Flexion (125) WNL  WNL(relieving)  Extension (15)    Abduction (40) WFL Limited *  Adduction     Internal Rotation (45) WFL [facility]  External Rotation (45) WNL WNL(relieving)  (* = pain; blank rows = not tested)   SENSATION:   Grossly intact to light touch bilateral LEs as determined by testing dermatomes L2-S2 Proprioception and hot/cold testing deferred on this date   STRENGTH: MMT   RLE LLE  Hip Flexion 5 5   Hip Extension    Hip Abduction (seated) 5 5  Hip Adduction  4* 4  Hip ER  3+ 3+* on R  Hip IR  5 5  Knee Extension 5 5  Knee Flexion 5 5  Dorsiflexion  5 5  Plantarflexion (seated) 5 5  (* = pain; blank rows = not tested)   MUSCLE LENGTH:   Hamstrings: B ~50 degrees [place] (quadriceps): Thomas (hip flexors): Ober: Adductors: R ~15 degrees L 30 degrees  ABDOMINAL:   Palpation: TTP over BUQ Diastasis: none present Scar mobility: not formally assessed  Rib flare: none noted   SPECIAL TESTS:   Centralization and Peripheralization (SN 92, -LR 0.12): Negative  SLR (SN 92, -LR 0.29): R: Negative L:  Negative FABER (SN 81): R: Negative L: Negative FADIR (SN 94): R: Negative L: Negative  EXTERNAL PELVIC EXAM:  Patient educated on the purpose of the procedure/exam and articulated understanding and consented to the procedure/exam. and verbal  Breath coordination: present with increased reps Voluntary Contraction: present, ~2/5 MMT Relaxation: full Perineal movement with sustained IAP increase ("bear down"): no change Perineal movement with rapid IAP increase ("cough"): descent   TREATMENT Manual Therapy:   Neuromuscular Re-education: Supine knee to chest with diaphragmatic breath/PFM lengthening, BLE,  for improved PFM spasm release Supine figure 4 stretch with  diaphragmatic breath/PFM lengthening for improved PFM spasm release Supine BUE OH reach with diaphragmatic breath for improved anterior chain length and posture   Therapeutic Exercise:   Treatments unbilled:  Post-treatment assessment:  Patient educated throughout session on appropriate technique and form using multi-modal cueing, HEP, and activity modification. Patient articulated understanding and returned demonstration.  Patient Response to interventions:   PATIENT SURVEYS:  FOTO Urinary Problem 52, Bowel Constipation 45, PFDI Pain 100  ASSESSMENT:  Clinical Impression: Patient presents to  clinic with excellent motivation to participate in therapy. Patient demonstrates deficits in posture, PFM strength, PFM coordination, bladder habits, and pain. Patient able to achieve relief in R SIJ/hip with supine stretches during today's session and responded positively to active interventions. Patient will benefit from continued skilled therapeutic intervention to address remaining deficits in posture, PFM strength, PFM coordination, bladder habits, and pain in order to increase function and improve overall QOL.  Objective impairments: decreased activity tolerance, decreased coordination, decreased endurance, difficulty walking, decreased strength, improper body mechanics, postural dysfunction, and pain.   Activity limitations: cleaning, laundry, community activity, and occupation.   Personal factors: Behavior pattern, Past/current experiences, Time since onset of injury/illness/exacerbation, and 3+ comorbidities: anxiety, chronic migraine, history of HTN, spondylosis of lumbar region  are also affecting patient's functional outcome.   Rehab Potential: Fair    Clinical decision making: Evolving/moderate complexity  Evaluation complexity: Moderate   GOALS: Goals reviewed with patient? Yes  LONG TERM GOALS: Target date: 06/14/2022  Patient will demonstrate improved function as evidenced by a score of > 55 on FOTO Urinary Problem measure for full participation in activities at home and in the community.  Baseline: 52 Goal status: INITIAL  Patient will demonstrate independence with HEP in order to maximize therapeutic gains and improve carryover from physical therapy sessions to ADLs in the home and community. Baseline: not initiated Goal status: INITIAL  Patient will demonstrate improved function as evidenced by a score of <60 on FOTO PFDI Pain measure for full participation in activities at home and in the community. Baseline: 100 Goal status: INITIAL  Patient will demonstrate  improved function as evidenced by a score of >49 on FOTO Bowel Constipation measure for full participation in activities at home and in the community. Baseline: 45 Goal status: INITIAL   PLAN: Rehab frequency: 1x/week  Rehab duration: 8 weeks  Planned interventions: Therapeutic exercises, Therapeutic activity, Neuromuscular re-education, Balance training, Gait training, Patient/Family education, Joint mobilization, Orthotic/Fit training, Electrical stimulation, Spinal mobilization, Cryotherapy, Moist heat, scar mobilization, Taping, and Manual therapy     Myles Gip PT, DPT [phone]  04/19/2022, 10:22 AM

## 2022-04-23 ENCOUNTER — Other Ambulatory Visit: Payer: Self-pay

## 2022-04-23 ENCOUNTER — Encounter: Payer: Self-pay | Admitting: Emergency Medicine

## 2022-04-23 ENCOUNTER — Ambulatory Visit
Admission: EM | Admit: 2022-04-23 | Discharge: 2022-04-23 | Disposition: A | Discharge status: Home / Self Care | Payer: Medicaid Other

## 2022-04-23 DIAGNOSIS — U071 COVID-19: Secondary | ICD-10-CM

## 2022-04-23 MED ORDER — MOLNUPIRAVIR 200 MG PO CAPS: 4.0000 | ORAL_CAPSULE | Freq: Two times a day (BID) | ORAL | 0 refills | Status: AC

## 2022-04-23 MED ORDER — BENZONATATE 200 MG PO CAPS
200.0000 mg | ORAL_CAPSULE | Freq: Three times a day (TID) | ORAL | 0 refills | Status: AC | PRN
Start: 2022-04-23 — End: ?

## 2022-04-23 NOTE — ED Triage Notes (Signed)
Patient c/o cough for the past 3 days.  Patient denies fevers.  Patient states that she was seen in the ED last night for back pain and was given a morphine and Tordal injection.  Patient states that she woke up this morning and her cold symptoms were worse.  Patient took a home covid test this morning and was positive.

## 2022-04-23 NOTE — Discharge Instructions (Addendum)
Stay quarantined for 5 days from onset of your symptoms. Wear a mask on day 6th

## 2022-04-23 NOTE — ED Provider Notes (Signed)
[facility]    CSN: [id number] Arrival date & time: 04/23/22  1111      History   Chief Complaint Chief Complaint  Patient presents with   Cough    COVID +    HPI Amy Hubbard is a 51 y.o. female with onset of ST and mild achyness 3 days ago and felt hot and cold. Yesterday had mild horseness with mild cough. Had no fever yesterday. Today the ST is still present  with worse rhinitis and cough. Has not had a fever. Is fatigued, but denies change in her appetite.  Has had 3 covid shots   Past Medical History:  Diagnosis Date   Anxiety    Complication of anesthesia    PONV (postoperative nausea and vomiting)    Uterine fibroid     Patient Active Problem List   Diagnosis Date Noted   Postoperative state 05/31/2019    Past Surgical History:  Procedure Laterality Date   ABDOMINAL HYSTERECTOMY     BREAST BIOPSY Right 03/17/2017   fibroadenoma, bx/clip   CESAREAN SECTION N/A 10/29/2005   IUD REMOVAL     LAPAROSCOPIC VAGINAL HYSTERECTOMY WITH SALPINGECTOMY Bilateral 05/31/2019   Procedure: LAPAROSCOPIC ASSISTED VAGINAL HYSTERECTOMY WITH SALPINGECTOMY;  Surgeon: Boykin Nearing, MD;  Location: [facility] ORS;  Service: Gynecology;  Laterality: Bilateral;    OB History     Gravida  2   Para      Term      Preterm      AB      Living  2      SAB      IAB      Ectopic      Multiple      Live Births  2            Home Medications    Prior to Admission medications   Medication Sig Start Date End Date Taking? Authorizing Provider  acetaminophen (TYLENOL) 650 MG CR tablet Take by mouth.   Yes [provider]  AIMOVIG 140 MG/ML SOAJ SMARTSIG:140 Milligram(s) SUB-Q Every 4 Weeks 04/05/22  Yes [provider]  benzonatate (TESSALON) 200 MG capsule Take 1 capsule (200 mg total) by mouth 3 (three) times daily as needed for cough. 04/23/22  Yes Rodriguez-Southworth, Sunday Spillers, PA-C  doxazosin (CARDURA) 2 MG tablet Take 4 mg  by mouth at bedtime. 03/31/22  Yes [provider]  gabapentin (NEURONTIN) 100 MG capsule Take 2 capsules p.o. in the morning, 1 capsule in the afternoon and 2 capsules in the evening 03/23/22  Yes [provider]  molnupiravir EUA (LAGEVRIO) 200 MG CAPS capsule Take 4 capsules (800 mg total) by mouth 2 (two) times daily for 5 days. 04/23/22 04/28/22 Yes Rodriguez-Southworth, Sunday Spillers, PA-C  oxybutynin (DITROPAN-XL) 10 MG 24 hr tablet Take 1 tablet (10 mg total) by mouth daily. 12/23/21  Yes Jaquita Folds, MD  oxyCODONE (OXY IR/ROXICODONE) 5 MG immediate release tablet Take by mouth. 04/23/22  Yes [provider]  promethazine (PHENERGAN) 12.5 MG tablet Take 12.5 mg by mouth every 6 (six) hours as needed. 04/12/22  Yes [provider]  tiZANidine (ZANAFLEX) 2 MG tablet Take by mouth. 03/31/22 03/31/23 Yes [provider]  Ubrogepant (UBRELVY) 100 MG TABS Take by mouth. 04/01/21  Yes [provider]  diclofenac Sodium (VOLTAREN) 1 % GEL Apply topically. 07/21/21   [provider]  senna (SENOKOT) 8.6 MG TABS tablet Take 1 tablet (8.6 mg total) by mouth 3 (three)  times daily as needed for up to 20 doses for mild constipation or moderate constipation. 01/25/22   Naaman Plummer, MD    Family History Family History  Problem Relation Age of Onset   Breast cancer Neg Hx     Social History Social History   Tobacco Use   Smoking status: Never   Smokeless tobacco: Never  Vaping Use   Vaping Use: Never used  Substance Use Topics   Alcohol use: Yes    Alcohol/week: 2.0 standard drinks of alcohol    Types: 2 Glasses of wine per week   Drug use: Never     Allergies   Patient has no known allergies.   Review of Systems Review of Systems  Constitutional:  Positive for chills, diaphoresis and fatigue. Negative for activity change, appetite change and fever.  HENT:  Positive for postnasal drip, rhinorrhea and sore throat. Negative for ear  discharge, ear pain and trouble swallowing.   Eyes:  Negative for discharge.  Respiratory:  Positive for cough. Negative for chest tightness and shortness of breath.   Cardiovascular:  Negative for chest pain.  Musculoskeletal:  Positive for back pain. Negative for myalgias.  Neurological:  Negative for headaches.     Physical Exam Triage Vital Signs ED Triage Vitals  Enc Vitals Group     BP 04/23/22 1153 105/70     Pulse Rate 04/23/22 1153 (!) 102     Resp 04/23/22 1153 14     Temp 04/23/22 1153 99.9 F (37.7 C)     Temp Source 04/23/22 1153 Oral     SpO2 04/23/22 1153 97 %     Weight 04/23/22 1148 165 lb (74.8 kg)     Height 04/23/22 1148 '5\' 2"'$  (1.575 m)     Head Circumference --      Peak Flow --      Pain Score 04/23/22 1148 2     Pain Loc --      Pain Edu? --      Excl. in [place]? --    No data found.  Updated Vital Signs BP 105/70 (BP Location: Right Arm)   Pulse (!) 102   Temp 99.9 F (37.7 C) (Oral)   Resp 14   Ht '5\' 2"'$  (1.575 m)   Wt 165 lb (74.8 kg)   LMP 05/15/2019   SpO2 97%   BMI 30.18 kg/m   Visual Acuity Right Eye Distance:   Left Eye Distance:   Bilateral Distance:    Right Eye Near:   Left Eye Near:    Bilateral Near:     Physical Exam Physical Exam Vitals signs and nursing note reviewed.  Constitutional:      General: She is not in acute distress.    Appearance: Normal appearance. She is not ill-appearing, toxic-appearing or diaphoretic.  HENT:     Head: Normocephalic.     Right Ear: Tympanic membrane, ear canal and external ear normal.     Left Ear: Tympanic membrane, ear canal and external ear normal.     Nose: Nose normal.     Mouth/Throat: clear    Mouth: Mucous membranes are moist.  Eyes:     General: No scleral icterus.       Right eye: No discharge.        Left eye: No discharge.     Conjunctiva/sclera: Conjunctivae normal.  Neck:     Musculoskeletal: Neck supple. No neck rigidity.  Cardiovascular:     Rate and Rhythm:  Normal rate and regular rhythm.     Heart sounds: No murmur.  Pulmonary:     Effort: Pulmonary effort is normal.     Breath sounds: Normal breath sounds.  Musculoskeletal: Normal range of motion.  Lymphadenopathy:     Cervical: No cervical adenopathy.  Skin:    General: Skin is warm and dry.     Coloration: Skin is not jaundiced.     Findings: No rash.  Neurological:     Mental Status: She is alert and oriented to person, place, and time.     Gait: Gait normal.  Psychiatric:        Mood and Affect: Mood normal.        Behavior: Behavior normal.        Thought Content: Thought content normal.        Judgment: Judgment normal.    UC Treatments / Results  Labs (all labs ordered are listed, but only abnormal results are displayed) Labs Reviewed - No data to display  EKG   Radiology No results found.  Procedures Procedures (including critical care time)  Medications Ordered in UC Medications - No data to display  Initial Impression / Assessment and Plan / UC Course  I have reviewed the triage vital signs and the nursing notes.  Covid infection  I placed her on mulnupiravir and tessalon as noted.  See instructions.  Final Clinical Impressions(s) / UC Diagnoses   Final diagnoses:  COVID-19 virus infection     Discharge Instructions      Stay quarantined for 5 days from onset of your symptoms. Wear a mask on day 6th      ED Prescriptions     Medication Sig Dispense Auth. Provider   benzonatate (TESSALON) 200 MG capsule Take 1 capsule (200 mg total) by mouth 3 (three) times daily as needed for cough. 30 capsule Rodriguez-Southworth, Adrine Hayworth, PA-C   molnupiravir EUA (LAGEVRIO) 200 MG CAPS capsule Take 4 capsules (800 mg total) by mouth 2 (two) times daily for 5 days. 40 capsule Rodriguez-Southworth, Sunday Spillers, PA-C      PDMP not reviewed this encounter.   Shelby Mattocks, PA-C 04/23/22 1213

## 2022-04-26 ENCOUNTER — Encounter: Payer: Medicaid Other | Admitting: Physical Therapy

## 2022-04-28 ENCOUNTER — Ambulatory Visit: Payer: Medicaid Other

## 2022-05-03 ENCOUNTER — Ambulatory Visit: Payer: Medicaid Other | Admitting: Physical Therapy

## 2022-05-03 ENCOUNTER — Encounter: Payer: Self-pay | Admitting: Physical Therapy

## 2022-05-03 DIAGNOSIS — R278 Other lack of coordination: Secondary | ICD-10-CM | POA: Diagnosis not present

## 2022-05-03 DIAGNOSIS — M6281 Muscle weakness (generalized): Secondary | ICD-10-CM

## 2022-05-03 DIAGNOSIS — R293 Abnormal posture: Secondary | ICD-10-CM

## 2022-05-03 NOTE — Therapy (Signed)
OUTPATIENT PHYSICAL THERAPY FEMALE PELVIC TREATMENT   Patient Name: Amy Hubbard MRN: [id number] DOB:1971-06-06, 51 y.o., female Today's Date: 05/03/2022   PT End of Session - 05/03/22 1029     Visit Number 3    Number of Visits 8    Date for PT Re-Evaluation 06/08/22    Authorization Type IE 04/13/2022    PT Start Time 1030    PT Stop Time 1110    PT Time Calculation (min) 40 min    Activity Tolerance Patient tolerated treatment well    Behavior During Therapy [facility] for tasks assessed/performed             Past Medical History:  Diagnosis Date   Anxiety    Complication of anesthesia    PONV (postoperative nausea and vomiting)    Uterine fibroid    Past Surgical History:  Procedure Laterality Date   ABDOMINAL HYSTERECTOMY     BREAST BIOPSY Right 03/17/2017   fibroadenoma, bx/clip   CESAREAN SECTION N/A 10/29/2005   IUD REMOVAL     LAPAROSCOPIC VAGINAL HYSTERECTOMY WITH SALPINGECTOMY Bilateral 05/31/2019   Procedure: LAPAROSCOPIC ASSISTED VAGINAL HYSTERECTOMY WITH SALPINGECTOMY;  Surgeon: Boykin Nearing, MD;  Location: [facility] ORS;  Service: Gynecology;  Laterality: Bilateral;   Patient Active Problem List   Diagnosis Date Noted   Postoperative state 05/31/2019    PCP: Barbaraann Boys, MD  REFERRING PROVIDER: Barbaraann Boys, MD  REFERRING DIAG: [phone] (ICD-10-CM) - Overflow incontinence M54.2 (ICD-10-CM) - Cervicalgia G89.29 (ICD-10-CM) - Other chronic pain M53.3 (ICD-10-CM) - Sacrococcygeal disorders, not elsewhere classified   THERAPY DIAG:  Other lack of coordination  Muscle weakness (generalized)  Abnormal posture  Rationale for Evaluation and Treatment Rehabilitation  PRECAUTIONS: None  WEIGHT BEARING RESTRICTIONS No  FALLS:  Has patient fallen in last 6 months?  No falls; but has progressive balance concerns. Being followed by neurology at [facility].  ONSET DATE: 2021  SUBJECTIVE: Patient reports that she is feeling okay, but  is having increased LBP. She notes she is a little more achey on the R side. Patient has been doing daily HEP stretches.    PERTINENT HISTORY/CHART REVIEW:  Red flags (bowel/bladder changes, saddle paresthesia, personal history of cancer, h/o spinal tumors, h/o compression fx, h/o abdominal aneurysm, abdominal pain, chills/fever, night sweats, nausea, vomiting, unrelenting pain, first onset of insidious LBP <20 y/o): Negative  "-Urinary incontinence. Started in Dec 2021, was picking something up and felt back pain, then she wet herself. Has had urinary incontinence since then.  -Feels urgency, will try to urinate but will not get a lot of volume out. When standing up from toilet, will dribble. Will also dribble throughout the day. By end of the day, her pad is semi-wet.  -Has also had a few episodes of large-volume urinary incontinence that "come out of nowhere." One happened while at work in 2022 (at [employer]) so she went to the ED, bladder U/S showed high post-void residual volume. No structural anomalies/obstruction.  -Some leakage with coughing/sneezing, no leakage when straining to have BM (has history of constipation)  -Symptoms had been constant since onset in 2021 but recently began to get worse. More frequent episodes of incontinence with increased volume.  - No dysuria, hematuria, abdominal pain -Follows with Urogyn in [place], looking to see someone closer. Work-up in the past has been negative for bladder prolapse. Currently on 10 mg oxybutynin for the past year, has not been helping.  - Tried pelvic floor PT in 2022, 3-4 visits,  helped some.  - Hx of 1 vaginal delivery  -Has been having tremors, numbness/tingling in hands. Following with neurology, currently undergoing work-up for multiple sclerosis  Overflow incontinence of urine Suboptimal treatment. 1.5 years of urinary incontinence, constant dribbling with occasional episodes of large-volume incontinence. Her symptoms are  most consistent with neurogenic bladder, especially because the large volume incontinence occurs suddenly without warning. Of note, patient is currently undergoing work-up for multiple sclerosis, which could explain onset of neurogenic bladder. Though she endorses some urgency, her symptoms have not improved with 10 mg oxybutynin, making urge incontinence less likely. Additionally, her incontinence episodes are typically not related to coughing/sneezing/etc., making stress incontinence less probable. UA reassuring against UTI. We will plan to treat empirically for neurogenic bladder while awaiting the results of her MS work-up.  [facility] POC Urinalysis Chemical; Future - [facility] POC Urinalysis Chemical - doxazosin (CARDURA) 2 MG tablet; Take 2 tablets (4 mg total) by mouth at bedtime - Ambulatory Referral to UrogynecologyJanene Harvey, MD note 03/31/2022   PAIN:  Are you having pain? Yes NPRS scale:  5-6/10 (current)  Pain location: B SIJ Pain type: constant Pain descriptors:  stiff and sore  PLOF:  Independent  PATIENT GOALS: "Get me to the point where I feel stronger in my pelvic area and I can stand up without feeling like my whole body wants to turn into a massive knot of pain"   OBJECTIVE:  04/19/2022:  RANGE OF MOTION:   AROM (Normal range in degrees) AROM  04/19/2022  Lumbar   Flexion (65) WFL (stiff stretch)  Extension (30) Negligible*  Right lateral flexion (25) ~10 *  Left lateral flexion (25) limited  Right rotation (30) Negligible*  Left rotation (30) limited      Hip LEFT RIGHT  Flexion (125) WNL  WNL(relieving)  Extension (15)    Abduction (40) WFL Limited *  Adduction     Internal Rotation (45) WFL WFL  External Rotation (45) WNL WNL(relieving)  (* = pain; blank rows = not tested)   SENSATION:   Grossly intact to light touch bilateral LEs as determined by testing dermatomes L2-S2 Proprioception and hot/cold testing deferred on this date   STRENGTH: MMT   RLE LLE   Hip Flexion 5 5  Hip Extension    Hip Abduction (seated) 5 5  Hip Adduction  4* 4  Hip ER  3+ 3+* on R  Hip IR  5 5  Knee Extension 5 5  Knee Flexion 5 5  Dorsiflexion  5 5  Plantarflexion (seated) 5 5  (* = pain; blank rows = not tested)   MUSCLE LENGTH:   Hamstrings: B ~50 degrees Ely (quadriceps): Thomas (hip flexors): Ober: Adductors: R ~15 degrees L 30 degrees  ABDOMINAL:   Palpation: TTP over BUQ Diastasis: none present Scar mobility: not formally assessed  Rib flare: none noted   SPECIAL TESTS:   Centralization and Peripheralization (SN 92, -LR 0.12): Negative  SLR (SN 92, -LR 0.29): R: Negative L:  Negative FABER (SN 81): R: Negative L: Negative FADIR (SN 94): R: Negative L: Negative  EXTERNAL PELVIC EXAM:  Patient educated on the purpose of the procedure/exam and articulated understanding and consented to the procedure/exam. and verbal  Breath coordination: present with increased reps Voluntary Contraction: present, ~2/5 MMT Relaxation: full Perineal movement with sustained IAP increase ("bear down"): no change Perineal movement with rapid IAP increase ("cough"): descent  05/03/2022 TREATMENT Manual Therapy: STM and TPR performed to R hip and lumbar  complex to allow for decreased tension and pain and improved posture and function using a vibratory and percussive device R innominate mobilizations for decreased pain and improved mobility, grade II/III (posterior) R sacral border mobilizations, grade I for improved mobility and decreased pain  Neuromuscular Re-education: Seated mermaid stretch R for improved postural alignment and decreased pain Standing mermaid stretch R for improved postural alignment and decreased pain Standing L hip flexor stretch for improved pelvic alignment and decreased pain Standing B adductor stretch at counter for improved pain and posture   Therapeutic Exercise:   Treatments unbilled:  Post-treatment  assessment:  Patient educated throughout session on appropriate technique and form using multi-modal cueing, HEP, and activity modification. Patient articulated understanding and returned demonstration.  Patient Response to interventions: Notes feeling much looser after manual, but still a bit tender at R SIJ/hip  PATIENT SURVEYS:  FOTO Urinary Problem 52, Bowel Constipation 45, PFDI Pain 100  ASSESSMENT:  Clinical Impression: Patient presents to clinic with excellent motivation to participate in therapy. Patient demonstrates deficits in posture, PFM strength, PFM coordination, bladder habits, and pain. Patient had subjective reduction in pain and improvement in spinal mobility with manual interventions during today's session and responded positively to active interventions. Patient will benefit from continued skilled therapeutic intervention to address remaining deficits in posture, PFM strength, PFM coordination, bladder habits, and pain in order to increase function and improve overall QOL.  Objective impairments: decreased activity tolerance, decreased coordination, decreased endurance, difficulty walking, decreased strength, improper body mechanics, postural dysfunction, and pain.   Activity limitations: cleaning, laundry, community activity, and occupation.   Personal factors: Behavior pattern, Past/current experiences, Time since onset of injury/illness/exacerbation, and 3+ comorbidities: anxiety, chronic migraine, history of HTN, spondylosis of lumbar region  are also affecting patient's functional outcome.   Rehab Potential: Fair    Clinical decision making: Evolving/moderate complexity  Evaluation complexity: Moderate   GOALS: Goals reviewed with patient? Yes  LONG TERM GOALS: Target date: 06/28/2022  Patient will demonstrate improved function as evidenced by a score of > 55 on FOTO Urinary Problem measure for full participation in activities at home and in the community.   Baseline: 52 Goal status: INITIAL  Patient will demonstrate independence with HEP in order to maximize therapeutic gains and improve carryover from physical therapy sessions to ADLs in the home and community. Baseline: not initiated Goal status: INITIAL  Patient will demonstrate improved function as evidenced by a score of <60 on FOTO PFDI Pain measure for full participation in activities at home and in the community. Baseline: 100 Goal status: INITIAL  Patient will demonstrate improved function as evidenced by a score of >49 on FOTO Bowel Constipation measure for full participation in activities at home and in the community. Baseline: 45 Goal status: INITIAL   PLAN: Rehab frequency: 1x/week  Rehab duration: 8 weeks  Planned interventions: Therapeutic exercises, Therapeutic activity, Neuromuscular re-education, Balance training, Gait training, Patient/Family education, Joint mobilization, Orthotic/Fit training, Electrical stimulation, Spinal mobilization, Cryotherapy, Moist heat, scar mobilization, Taping, and Manual therapy     Myles Gip PT, DPT [phone]  05/03/2022, 10:30 AM

## 2022-05-06 ENCOUNTER — Ambulatory Visit
Admission: RE | Admit: 2022-05-06 | Discharge: 2022-05-06 | Disposition: A | Discharge status: Home / Self Care | Payer: Medicaid Other | Source: Ambulatory Visit | Attending: Neurology | Admitting: Neurology

## 2022-05-06 DIAGNOSIS — R251 Tremor, unspecified: Secondary | ICD-10-CM | POA: Diagnosis present

## 2022-05-10 ENCOUNTER — Encounter: Payer: Medicaid Other | Admitting: Physical Therapy

## 2022-05-11 ENCOUNTER — Ambulatory Visit: Payer: Medicaid Other | Admitting: Physical Therapy

## 2022-05-11 ENCOUNTER — Encounter: Payer: Self-pay | Admitting: Physical Therapy

## 2022-05-11 DIAGNOSIS — R278 Other lack of coordination: Secondary | ICD-10-CM

## 2022-05-11 DIAGNOSIS — R293 Abnormal posture: Secondary | ICD-10-CM

## 2022-05-11 DIAGNOSIS — M6281 Muscle weakness (generalized): Secondary | ICD-10-CM

## 2022-05-11 NOTE — Therapy (Signed)
OUTPATIENT PHYSICAL THERAPY FEMALE PELVIC TREATMENT   Patient Name: Amy Hubbard MRN: [id number] DOB:29-Jun-1971, 51 y.o., female Today's Date: 05/11/2022   PT End of Session - 05/11/22 1038     Visit Number 4    Number of Visits 8    Date for PT Re-Evaluation 06/08/22    Authorization Type IE 04/13/2022    PT Start Time 1030    PT Stop Time 1110    PT Time Calculation (min) 40 min    Activity Tolerance Patient tolerated treatment well    Behavior During Therapy [facility] for tasks assessed/performed             Past Medical History:  Diagnosis Date   Anxiety    Complication of anesthesia    PONV (postoperative nausea and vomiting)    Uterine fibroid    Past Surgical History:  Procedure Laterality Date   ABDOMINAL HYSTERECTOMY     BREAST BIOPSY Right 03/17/2017   fibroadenoma, bx/clip   CESAREAN SECTION N/A 10/29/2005   IUD REMOVAL     LAPAROSCOPIC VAGINAL HYSTERECTOMY WITH SALPINGECTOMY Bilateral 05/31/2019   Procedure: LAPAROSCOPIC ASSISTED VAGINAL HYSTERECTOMY WITH SALPINGECTOMY;  Surgeon: Boykin Nearing, MD;  Location: [facility] ORS;  Service: Gynecology;  Laterality: Bilateral;   Patient Active Problem List   Diagnosis Date Noted   Postoperative state 05/31/2019    PCP: Barbaraann Boys, MD  REFERRING PROVIDER: Barbaraann Boys, MD  REFERRING DIAG: [phone] (ICD-10-CM) - Overflow incontinence M54.2 (ICD-10-CM) - Cervicalgia G89.29 (ICD-10-CM) - Other chronic pain M53.3 (ICD-10-CM) - Sacrococcygeal disorders, not elsewhere classified   THERAPY DIAG:  Other lack of coordination  Muscle weakness (generalized)  Abnormal posture  Rationale for Evaluation and Treatment Rehabilitation  PRECAUTIONS: None  WEIGHT BEARING RESTRICTIONS No  FALLS:  Has patient fallen in last 6 months?  No falls; but has progressive balance concerns. Being followed by neurology at [facility].  ONSET DATE: 2021  SUBJECTIVE: Patient notes increased stress with recent  neurological symptoms (dizziness, MRI results, facial numbness, numbness and tingling in extremities, muscle spasms). Patient does note LBP is well controlled and has soreness and pins/needles. Patient notes dizziness is easing up today, but does persist. Patient also notes with increased neurological symptoms she had increased UI with pad changes 3-4x. Patient also had difficulty with incontinence of flatus during the flare up. Patient did not fall over the course of the increased neuro symptoms; she did use SPC to reduce risk.    PERTINENT HISTORY/CHART REVIEW:  Red flags (bowel/bladder changes, saddle paresthesia, personal history of cancer, h/o spinal tumors, h/o compression fx, h/o abdominal aneurysm, abdominal pain, chills/fever, night sweats, nausea, vomiting, unrelenting pain, first onset of insidious LBP <19 y/o): Negative  "-Urinary incontinence. Started in Dec 2021, was picking something up and felt back pain, then she wet herself. Has had urinary incontinence since then.  -Feels urgency, will try to urinate but will not get a lot of volume out. When standing up from toilet, will dribble. Will also dribble throughout the day. By end of the day, her pad is semi-wet.  -Has also had a few episodes of large-volume urinary incontinence that "come out of nowhere." One happened while at work in 2022 (at [employer]) so she went to the ED, bladder U/S showed high post-void residual volume. No structural anomalies/obstruction.  -Some leakage with coughing/sneezing, no leakage when straining to have BM (has history of constipation)  -Symptoms had been constant since onset in 2021 but recently began to get worse. More  frequent episodes of incontinence with increased volume.  - No dysuria, hematuria, abdominal pain -Follows with Urogyn in [place], looking to see someone closer. Work-up in the past has been negative for bladder prolapse. Currently on 10 mg oxybutynin for the past year, has not been  helping.  - Tried pelvic floor PT in 2022, 3-4 visits, helped some.  - Hx of 1 vaginal delivery  -Has been having tremors, numbness/tingling in hands. Following with neurology, currently undergoing work-up for multiple sclerosis  Overflow incontinence of urine Suboptimal treatment. 1.5 years of urinary incontinence, constant dribbling with occasional episodes of large-volume incontinence. Her symptoms are most consistent with neurogenic bladder, especially because the large volume incontinence occurs suddenly without warning. Of note, patient is currently undergoing work-up for multiple sclerosis, which could explain onset of neurogenic bladder. Though she endorses some urgency, her symptoms have not improved with 10 mg oxybutynin, making urge incontinence less likely. Additionally, her incontinence episodes are typically not related to coughing/sneezing/etc., making stress incontinence less probable. UA reassuring against UTI. We will plan to treat empirically for neurogenic bladder while awaiting the results of her MS work-up.  [facility] POC Urinalysis Chemical; Future - [facility] POC Urinalysis Chemical - doxazosin (CARDURA) 2 MG tablet; Take 2 tablets (4 mg total) by mouth at bedtime - Ambulatory Referral to UrogynecologyJanene Harvey, MD note 03/31/2022   PAIN:  Are you having pain? Yes NPRS scale:  0/10 (current)  Pain location: B SIJ Pain type: constant Pain descriptors:  stiff and sore  PLOF:  Independent  PATIENT GOALS: "Get me to the point where I feel stronger in my pelvic area and I can stand up without feeling like my whole body wants to turn into a massive knot of pain"   OBJECTIVE:  04/19/2022:  RANGE OF MOTION:   AROM (Normal range in degrees) AROM  04/19/2022  Lumbar   Flexion (65) WFL (stiff stretch)  Extension (30) Negligible*  Right lateral flexion (25) ~10 *  Left lateral flexion (25) limited  Right rotation (30) Negligible*  Left rotation (30) limited      Hip LEFT RIGHT   Flexion (125) WNL  WNL(relieving)  Extension (15)    Abduction (40) WFL Limited *  Adduction     Internal Rotation (45) WFL WFL  External Rotation (45) WNL WNL(relieving)  (* = pain; blank rows = not tested)   SENSATION:   Grossly intact to light touch bilateral LEs as determined by testing dermatomes L2-S2 Proprioception and hot/cold testing deferred on this date   STRENGTH: MMT   RLE LLE  Hip Flexion 5 5  Hip Extension    Hip Abduction (seated) 5 5  Hip Adduction  4* 4  Hip ER  3+ 3+* on R  Hip IR  5 5  Knee Extension 5 5  Knee Flexion 5 5  Dorsiflexion  5 5  Plantarflexion (seated) 5 5  (* = pain; blank rows = not tested)   MUSCLE LENGTH:   Hamstrings: B ~50 degrees Ely (quadriceps): Thomas (hip flexors): Ober: Adductors: R ~15 degrees L 30 degrees  ABDOMINAL:   Palpation: TTP over BUQ Diastasis: none present Scar mobility: not formally assessed  Rib flare: none noted   SPECIAL TESTS:   Centralization and Peripheralization (SN 92, -LR 0.12): Negative  SLR (SN 92, -LR 0.29): R: Negative L:  Negative FABER (SN 81): R: Negative L: Negative FADIR (SN 94): R: Negative L: Negative  EXTERNAL PELVIC EXAM:  Patient educated on the purpose of the  procedure/exam and articulated understanding and consented to the procedure/exam. and verbal  Breath coordination: present with increased reps Voluntary Contraction: present, ~2/5 MMT Relaxation: full Perineal movement with sustained IAP increase ("bear down"): no change Perineal movement with rapid IAP increase ("cough"): descent  05/11/2022 TREATMENT Manual Therapy:   Neuromuscular Re-education: [facility] Supine diaphragmatic breathing Supine TrA with coordinated breath Supine heel slides with TrA, coordinated breath Supine marches with TrA, coordinated breath Supine knee to chest, BLE, for improved pain modulation and posture Supine hooklying trunk rotation for improved pain  modulation and posture   Therapeutic Exercise:   Treatments unbilled:  Post-treatment assessment:  Patient educated throughout session on appropriate technique and form using multi-modal cueing, HEP, and activity modification. Patient articulated understanding and returned demonstration.  Patient Response to interventions: No increased pain  PATIENT SURVEYS:  FOTO Urinary Problem 52, Bowel Constipation 45, PFDI Pain 100  ASSESSMENT:  Clinical Impression: Patient presents to clinic with excellent motivation to participate in therapy. Patient demonstrates deficits in posture, PFM strength, PFM coordination, bladder habits, and pain. Patient with good postural control with initial [facility] interventions during today's session and responded positively to active interventions. Patient will benefit from continued skilled therapeutic intervention to address remaining deficits in posture, PFM strength, PFM coordination, bladder habits, and pain in order to increase function and improve overall QOL.  Objective impairments: decreased activity tolerance, decreased coordination, decreased endurance, difficulty walking, decreased strength, improper body mechanics, postural dysfunction, and pain.   Activity limitations: cleaning, laundry, community activity, and occupation.   Personal factors: Behavior pattern, Past/current experiences, Time since onset of injury/illness/exacerbation, and 3+ comorbidities: anxiety, chronic migraine, history of HTN, spondylosis of lumbar region  are also affecting patient's functional outcome.   Rehab Potential: Fair    Clinical decision making: Evolving/moderate complexity  Evaluation complexity: Moderate   GOALS: Goals reviewed with patient? Yes  LONG TERM GOALS: Target date: 07/06/2022  Patient will demonstrate improved function as evidenced by a score of > 55 on FOTO Urinary Problem measure for full participation in activities at home and  in the community.  Baseline: 52 Goal status: INITIAL  Patient will demonstrate independence with HEP in order to maximize therapeutic gains and improve carryover from physical therapy sessions to ADLs in the home and community. Baseline: not initiated Goal status: INITIAL  Patient will demonstrate improved function as evidenced by a score of <60 on FOTO PFDI Pain measure for full participation in activities at home and in the community. Baseline: 100 Goal status: INITIAL  Patient will demonstrate improved function as evidenced by a score of >49 on FOTO Bowel Constipation measure for full participation in activities at home and in the community. Baseline: 45 Goal status: INITIAL   PLAN: Rehab frequency: 1x/week  Rehab duration: 8 weeks  Planned interventions: Therapeutic exercises, Therapeutic activity, Neuromuscular re-education, Balance training, Gait training, Patient/Family education, Joint mobilization, Orthotic/Fit training, Electrical stimulation, Spinal mobilization, Cryotherapy, Moist heat, scar mobilization, Taping, and Manual therapy     Myles Gip PT, DPT [phone]  05/11/2022, 10:39 AM

## 2022-05-24 ENCOUNTER — Ambulatory Visit: Payer: Medicaid Other | Admitting: Physical Therapy

## 2022-05-26 ENCOUNTER — Encounter: Payer: Self-pay | Admitting: Physical Therapy

## 2022-05-26 ENCOUNTER — Ambulatory Visit: Payer: Medicaid Other | Attending: Pediatrics | Admitting: Physical Therapy

## 2022-05-26 DIAGNOSIS — R278 Other lack of coordination: Secondary | ICD-10-CM | POA: Insufficient documentation

## 2022-05-26 DIAGNOSIS — M6281 Muscle weakness (generalized): Secondary | ICD-10-CM | POA: Diagnosis present

## 2022-05-26 DIAGNOSIS — R42 Dizziness and giddiness: Secondary | ICD-10-CM | POA: Insufficient documentation

## 2022-05-26 DIAGNOSIS — R293 Abnormal posture: Secondary | ICD-10-CM | POA: Diagnosis present

## 2022-05-26 NOTE — Therapy (Signed)
OUTPATIENT PHYSICAL THERAPY FEMALE PELVIC TREATMENT   Patient Name: Amy Hubbard MRN: [id number] DOB:Feb 11, 1971, 51 y.o., female Today's Date: 05/26/2022   PT End of Session - 05/26/22 1349     Visit Number 5    Number of Visits 8    Date for PT Re-Evaluation 06/08/22    Authorization Type IE 04/13/2022    PT Start Time 1345    PT Stop Time 1425    PT Time Calculation (min) 40 min    Activity Tolerance Patient tolerated treatment well    Behavior During Therapy [facility] for tasks assessed/performed             Past Medical History:  Diagnosis Date   Anxiety    Complication of anesthesia    PONV (postoperative nausea and vomiting)    Uterine fibroid    Past Surgical History:  Procedure Laterality Date   ABDOMINAL HYSTERECTOMY     BREAST BIOPSY Right 03/17/2017   fibroadenoma, bx/clip   CESAREAN SECTION N/A 10/29/2005   IUD REMOVAL     LAPAROSCOPIC VAGINAL HYSTERECTOMY WITH SALPINGECTOMY Bilateral 05/31/2019   Procedure: LAPAROSCOPIC ASSISTED VAGINAL HYSTERECTOMY WITH SALPINGECTOMY;  Surgeon: Amy Nearing, MD;  Location: [facility] ORS;  Service: Gynecology;  Laterality: Bilateral;   Patient Active Problem List   Diagnosis Date Noted   Postoperative state 05/31/2019    PCP: Amy Boys, MD  REFERRING PROVIDER: Barbaraann Boys, MD  REFERRING DIAG: [phone] (ICD-10-CM) - Overflow incontinence M54.2 (ICD-10-CM) - Cervicalgia G89.29 (ICD-10-CM) - Other chronic pain M53.3 (ICD-10-CM) - Sacrococcygeal disorders, not elsewhere classified   THERAPY DIAG:  Other lack of coordination  Muscle weakness (generalized)  Abnormal posture  Rationale for Evaluation and Treatment Rehabilitation  PRECAUTIONS: None  WEIGHT BEARING RESTRICTIONS No  FALLS:  Has patient fallen in last 6 months?  No falls; but has progressive balance concerns. Being followed by neurology at [facility].  ONSET DATE: 2021  SUBJECTIVE: Patient reports that she continues to deal with  increased dizziness. She reports that she is mostly troubled by her neurological symptoms at this time. Patient notes that urinary symptoms are improving, but she still has to change pads 2-3x/day. Patient notes UI is not linked to activity, but rather seems to happen unpredictably. Patient does note that her urinary symptoms seem to be worse when she is also experiencing bodily paresthesias and tension.    PERTINENT HISTORY/CHART REVIEW:  Red flags (bowel/bladder changes, saddle paresthesia, personal history of cancer, h/o spinal tumors, h/o compression fx, h/o abdominal aneurysm, abdominal pain, chills/fever, night sweats, nausea, vomiting, unrelenting pain, first onset of insidious LBP <20 y/o): Negative  "-Urinary incontinence. Started in Dec 2021, was picking something up and felt back pain, then she wet herself. Has had urinary incontinence since then.  -Feels urgency, will try to urinate but will not get a lot of volume out. When standing up from toilet, will dribble. Will also dribble throughout the day. By end of the day, her pad is semi-wet.  -Has also had a few episodes of large-volume urinary incontinence that "come out of nowhere." One happened while at work in 2022 (at [employer]) so she went to the ED, bladder U/S showed high post-void residual volume. No structural anomalies/obstruction.  -Some leakage with coughing/sneezing, no leakage when straining to have BM (has history of constipation)  -Symptoms had been constant since onset in 2021 but recently began to get worse. More frequent episodes of incontinence with increased volume.  - No dysuria, hematuria, abdominal pain -Follows  with Urog[place] in [place], looking to see someone closer. Work-up in the past has been negative for bladder prolapse. Currently on 10 mg oxybutynin for the past year, has not been helping.  - Tried pelvic floor PT in 2022, 3-4 visits, helped some.  - Hx of 1 vaginal delivery  -Has been having tremors,  numbness/tingling in hands. Following with neurology, currently undergoing work-up for multiple sclerosis  Overflow incontinence of urine Suboptimal treatment. 1.5 years of urinary incontinence, constant dribbling with occasional episodes of large-volume incontinence. Her symptoms are most consistent with neurogenic bladder, especially because the large volume incontinence occurs suddenly without warning. Of note, patient is currently undergoing work-up for multiple sclerosis, which could explain onset of neurogenic bladder. Though she endorses some urgency, her symptoms have not improved with 10 mg oxybutynin, making urge incontinence less likely. Additionally, her incontinence episodes are typically not related to coughing/sneezing/etc., making stress incontinence less probable. UA reassuring against UTI. We will plan to treat empirically for neurogenic bladder while awaiting the results of her MS work-up.  [facility] POC Urinalysis Chemical; Future - [facility] POC Urinalysis Chemical - doxazosin (CARDURA) 2 MG tablet; Take 2 tablets (4 mg total) by mouth at bedtime - Ambulatory Referral to UrogynecologyJanene Harvey, MD note 03/31/2022   PAIN:  Are you having pain? Yes NPRS scale:  0/10 (current)  Pain location: B SIJ Pain type: constant Pain descriptors:  stiff and sore  PLOF:  Independent  PATIENT GOALS: "Get me to the point where I feel stronger in my pelvic area and I can stand up without feeling like my whole body wants to turn into a massive knot of pain"   OBJECTIVE:  04/19/2022:  RANGE OF MOTION:   AROM (Normal range in degrees) AROM  04/19/2022  Lumbar   Flexion (65) WFL (stiff stretch)  Extension (30) Negligible*  Right lateral flexion (25) ~10 *  Left lateral flexion (25) limited  Right rotation (30) Negligible*  Left rotation (30) limited      Hip LEFT RIGHT  Flexion (125) WNL  WNL(relieving)  Extension (15)    Abduction (40) WFL Limited *  Adduction     Internal Rotation (45)  WFL WFL  External Rotation (45) WNL WNL(relieving)  (* = pain; blank rows = not tested)   SENSATION:   Grossly intact to light touch bilateral LEs as determined by testing dermatomes L2-S2 Proprioception and hot/cold testing deferred on this date   STRENGTH: MMT   RLE LLE  Hip Flexion 5 5  Hip Extension    Hip Abduction (seated) 5 5  Hip Adduction  4* 4  Hip ER  3+ 3+* on R  Hip IR  5 5  Knee Extension 5 5  Knee Flexion 5 5  Dorsiflexion  5 5  Plantarflexion (seated) 5 5  (* = pain; blank rows = not tested)   MUSCLE LENGTH:   Hamstrings: B ~50 degrees Ely (quadriceps): Thomas (hip flexors): Ober: Adductors: R ~15 degrees L 30 degrees  ABDOMINAL:   Palpation: TTP over BUQ Diastasis: none present Scar mobility: not formally assessed  Rib flare: none noted   SPECIAL TESTS:   Centralization and Peripheralization (SN 92, -LR 0.12): Negative  SLR (SN 92, -LR 0.29): R: Negative L:  Negative FABER (SN 81): R: Negative L: Negative FADIR (SN 94): R: Negative L: Negative  EXTERNAL PELVIC EXAM:  Patient educated on the purpose of the procedure/exam and articulated understanding and consented to the procedure/exam. and verbal  Breath coordination: present  with increased reps Voluntary Contraction: present, ~2/5 MMT Relaxation: full Perineal movement with sustained IAP increase ("bear down"): no change Perineal movement with rapid IAP increase ("cough"): descent  05/11/2022 TREATMENT Manual Therapy:   Neuromuscular Re-education: Patient education on neurogenic bladder presentation, symptoms, and management.  Patient education on bladder retraining and timed voiding, strategies for urge suppression, and tips to ensure complete emptying.  Reviewed HEP including: breath work, gentle abdominal training, and PFM coordination interventions.    Therapeutic Exercise:   Treatments unbilled:  Post-treatment assessment:  Patient educated throughout session on  appropriate technique and form using multi-modal cueing, HEP, and activity modification. Patient articulated understanding and returned demonstration.  Patient Response to interventions: No increased pain  PATIENT SURVEYS:  FOTO Urinary Problem 52, Bowel Constipation 45, PFDI Pain 100  ASSESSMENT:  Clinical Impression: Patient presents to clinic with excellent motivation to participate in therapy. Patient demonstrates deficits in posture, PFM strength, PFM coordination, bladder habits, and pain. Patient expressing some frustration with on-going neurological symptoms and lack of clarification on MRI results during today's session but responded positively to educational interventions. Patient will benefit from continued skilled therapeutic intervention to address remaining deficits in posture, PFM strength, PFM coordination, bladder habits, and pain in order to increase function and improve overall QOL.  Objective impairments: decreased activity tolerance, decreased coordination, decreased endurance, difficulty walking, decreased strength, improper body mechanics, postural dysfunction, and pain.   Activity limitations: cleaning, laundry, community activity, and occupation.   Personal factors: Behavior pattern, Past/current experiences, Time since onset of injury/illness/exacerbation, and 3+ comorbidities: anxiety, chronic migraine, history of HTN, spondylosis of lumbar region  are also affecting patient's functional outcome.   Rehab Potential: Fair    Clinical decision making: Evolving/moderate complexity  Evaluation complexity: Moderate   GOALS: Goals reviewed with patient? Yes  LONG TERM GOALS: Target date: 06/08/2022  Patient will demonstrate improved function as evidenced by a score of > 55 on FOTO Urinary Problem measure for full participation in activities at home and in the community.  Baseline: 52 Goal status: INITIAL  Patient will demonstrate independence with HEP in order to  maximize therapeutic gains and improve carryover from physical therapy sessions to ADLs in the home and community. Baseline: not initiated Goal status: INITIAL  Patient will demonstrate improved function as evidenced by a score of <60 on FOTO PFDI Pain measure for full participation in activities at home and in the community. Baseline: 100 Goal status: INITIAL  Patient will demonstrate improved function as evidenced by a score of >49 on FOTO Bowel Constipation measure for full participation in activities at home and in the community. Baseline: 45 Goal status: INITIAL   PLAN: Rehab frequency: 1x/week  Rehab duration: 8 weeks  Planned interventions: Therapeutic exercises, Therapeutic activity, Neuromuscular re-education, Balance training, Gait training, Patient/Family education, Joint mobilization, Orthotic/Fit training, Electrical stimulation, Spinal mobilization, Cryotherapy, Moist heat, scar mobilization, Taping, and Manual therapy     Myles Gip PT, DPT [phone]  05/26/2022, 1:53 PM

## 2022-06-07 ENCOUNTER — Encounter: Payer: Self-pay | Admitting: Physical Therapy

## 2022-06-07 ENCOUNTER — Ambulatory Visit: Payer: Medicaid Other | Admitting: Physical Therapy

## 2022-06-07 DIAGNOSIS — M6281 Muscle weakness (generalized): Secondary | ICD-10-CM

## 2022-06-07 DIAGNOSIS — R278 Other lack of coordination: Secondary | ICD-10-CM

## 2022-06-07 DIAGNOSIS — R293 Abnormal posture: Secondary | ICD-10-CM
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
## 2022-06-07 NOTE — Therapy (Signed)
OUTPATIENT PHYSICAL THERAPY FEMALE PELVIC TREATMENT   Patient Name: Amy Hubbard MRN: [id number] DOB:1971-10-07, 51 y.o., female Today's Date: 06/07/2022   PT End of Session - 06/07/22 1037     Visit Number 6    Number of Visits 8    Date for PT Re-Evaluation 06/08/22    Authorization Type IE 04/13/2022    PT Start Time 1030    PT Stop Time 1110    PT Time Calculation (min) 40 min    Activity Tolerance Patient tolerated treatment well    Behavior During Therapy [facility] for tasks assessed/performed             Past Medical History:  Diagnosis Date   Anxiety    Complication of anesthesia    PONV (postoperative nausea and vomiting)    Uterine fibroid    Past Surgical History:  Procedure Laterality Date   ABDOMINAL HYSTERECTOMY     BREAST BIOPSY Right 03/17/2017   fibroadenoma, bx/clip   CESAREAN SECTION N/A 10/29/2005   IUD REMOVAL     LAPAROSCOPIC VAGINAL HYSTERECTOMY WITH SALPINGECTOMY Bilateral 05/31/2019   Procedure: LAPAROSCOPIC ASSISTED VAGINAL HYSTERECTOMY WITH SALPINGECTOMY;  Surgeon: Boykin Nearing, MD;  Location: [facility] ORS;  Service: Gynecology;  Laterality: Bilateral;   Patient Active Problem List   Diagnosis Date Noted   Postoperative state 05/31/2019    PCP: Barbaraann Boys, MD  REFERRING PROVIDER: Barbaraann Boys, MD  REFERRING DIAG: [phone] (ICD-10-CM) - Overflow incontinence M54.2 (ICD-10-CM) - Cervicalgia G89.29 (ICD-10-CM) - Other chronic pain M53.3 (ICD-10-CM) - Sacrococcygeal disorders, not elsewhere classified   THERAPY DIAG:  Other lack of coordination  Muscle weakness (generalized)  Abnormal posture  Rationale for Evaluation and Treatment Rehabilitation  PRECAUTIONS: None  WEIGHT BEARING RESTRICTIONS No  FALLS:  Has patient fallen in last 6 months?  No falls; but has progressive balance concerns. Being followed by neurology at [facility].  ONSET DATE: 2021  SUBJECTIVE: Patient notes that dizziness and unsteadiness  with gait continue to be a burden. Patient has increased back pain as a result of having to park further away at work. Patient has been performing stretches and exercises which offer some relief. Patient also has had some improvement in urinary and bowel complaints.    PERTINENT HISTORY/CHART REVIEW:  Red flags (bowel/bladder changes, saddle paresthesia, personal history of cancer, h/o spinal tumors, h/o compression fx, h/o abdominal aneurysm, abdominal pain, chills/fever, night sweats, nausea, vomiting, unrelenting pain, first onset of insidious LBP <20 y/o): Negative  "-Urinary incontinence. Started in Dec 2021, was picking something up and felt back pain, then she wet herself. Has had urinary incontinence since then.  -Feels urgency, will try to urinate but will not get a lot of volume out. When standing up from toilet, will dribble. Will also dribble throughout the day. By end of the day, her pad is semi-wet.  -Has also had a few episodes of large-volume urinary incontinence that "come out of nowhere." One happened while at work in 2022 (at [employer]) so she went to the ED, bladder U/S showed high post-void residual volume. No structural anomalies/obstruction.  -Some leakage with coughing/sneezing, no leakage when straining to have BM (has history of constipation)  -Symptoms had been constant since onset in 2021 but recently began to get worse. More frequent episodes of incontinence with increased volume.  - No dysuria, hematuria, abdominal pain -Follows with Urogyn in [place], looking to see someone closer. Work-up in the past has been negative for bladder prolapse. Currently on 10  mg oxybutynin for the past year, has not been helping.  - Tried pelvic floor PT in 2022, 3-4 visits, helped some.  - Hx of 1 vaginal delivery  -Has been having tremors, numbness/tingling in hands. Following with neurology, currently undergoing work-up for multiple sclerosis  Overflow incontinence of  urine Suboptimal treatment. 1.5 years of urinary incontinence, constant dribbling with occasional episodes of large-volume incontinence. Her symptoms are most consistent with neurogenic bladder, especially because the large volume incontinence occurs suddenly without warning. Of note, patient is currently undergoing work-up for multiple sclerosis, which could explain onset of neurogenic bladder. Though she endorses some urgency, her symptoms have not improved with 10 mg oxybutynin, making urge incontinence less likely. Additionally, her incontinence episodes are typically not related to coughing/sneezing/etc., making stress incontinence less probable. UA reassuring against UTI. We will plan to treat empirically for neurogenic bladder while awaiting the results of her MS work-up.  [facility] POC Urinalysis Chemical; Future - [facility] POC Urinalysis Chemical - doxazosin (CARDURA) 2 MG tablet; Take 2 tablets (4 mg total) by mouth at bedtime - Ambulatory Referral to UrogynecologyJanene Harvey, MD note 03/31/2022   PAIN:  Are you having pain? Yes NPRS scale:  0/10 (current)  Pain location: B SIJ Pain type: constant Pain descriptors:  stiff and sore  PLOF:  Independent  PATIENT GOALS: "Get me to the point where I feel stronger in my pelvic area and I can stand up without feeling like my whole body wants to turn into a massive knot of pain"   OBJECTIVE:  04/19/2022:  RANGE OF MOTION:   AROM (Normal range in degrees) AROM  04/19/2022  Lumbar   Flexion (65) WFL (stiff stretch)  Extension (30) Negligible*  Right lateral flexion (25) ~10 *  Left lateral flexion (25) limited  Right rotation (30) Negligible*  Left rotation (30) limited      Hip LEFT RIGHT  Flexion (125) WNL  WNL(relieving)  Extension (15)    Abduction (40) WFL Limited *  Adduction     Internal Rotation (45) WFL WFL  External Rotation (45) WNL WNL(relieving)  (* = pain; blank rows = not tested)   SENSATION:   Grossly intact to  light touch bilateral LEs as determined by testing dermatomes L2-S2 Proprioception and hot/cold testing deferred on this date   STRENGTH: MMT   RLE LLE  Hip Flexion 5 5  Hip Extension    Hip Abduction (seated) 5 5  Hip Adduction  4* 4  Hip ER  3+ 3+* on R  Hip IR  5 5  Knee Extension 5 5  Knee Flexion 5 5  Dorsiflexion  5 5  Plantarflexion (seated) 5 5  (* = pain; blank rows = not tested)   MUSCLE LENGTH:   Hamstrings: B ~50 degrees [place] (quadriceps): Thomas (hip flexors): Ober: Adductors: R ~15 degrees L 30 degrees  ABDOMINAL:   Palpation: TTP over BUQ Diastasis: none present Scar mobility: not formally assessed  Rib flare: none noted   SPECIAL TESTS:   Centralization and Peripheralization (SN 92, -LR 0.12): Negative  SLR (SN 92, -LR 0.29): R: Negative L:  Negative FABER (SN 81): R: Negative L: Negative FADIR (SN 94): R: Negative L: Negative  EXTERNAL PELVIC EXAM:  Patient educated on the purpose of the procedure/exam and articulated understanding and consented to the procedure/exam. and verbal  Breath coordination: present with increased reps Voluntary Contraction: present, ~2/5 MMT Relaxation: full Perineal movement with sustained IAP increase ("bear down"): no change Perineal movement  with rapid IAP increase ("cough"): descent  06/07/2022 TREATMENT Manual Therapy:   Neuromuscular Re-education: Reassessed goals; see below.  EXTERNAL PELVIC EXAM:  Patient educated on the purpose of the procedure/exam and articulated understanding and consented to the procedure/exam. and verbal  Breath coordination: present Voluntary Contraction: present, ~2/5 MMT Relaxation: delayed Perineal movement with sustained IAP increase ("bear down"): descent Perineal movement with rapid IAP increase ("cough"): no movement  Therapeutic Exercise:   Treatments unbilled:  Post-treatment assessment:  Patient educated throughout session on appropriate technique and form  using multi-modal cueing, HEP, and activity modification. Patient articulated understanding and returned demonstration.  Patient Response to interventions: No increased pain  PATIENT SURVEYS:  FOTO Urinary Problem 52, Bowel Constipation 45, PFDI Pain 100  ASSESSMENT:  Clinical Impression: Patient presents to clinic with excellent motivation to participate in therapy, but is ready to transition to management of balance and vestibular symptoms. Patient demonstrates persistent deficits in posture, PFM strength, PFM coordination, bladder habits, and pain, but has made progress toward or met all initial goals set forth in PFPT. Patient and DPT discussed prioritizing safety by transitioning to vestibular physical therapy interventions with Roxana Hires, PT, DPT, [facility] with potential to return to PFPT should symptoms re-emerge or worsen after completing vestibular course of care.  Patient may benefit from continued skilled therapeutic intervention to address remaining deficits in posture, PFM strength, PFM coordination, bladder habits, and pain in order to increase function and improve overall QOL.  Objective impairments: decreased activity tolerance, decreased coordination, decreased endurance, difficulty walking, decreased strength, improper body mechanics, postural dysfunction, and pain.   Activity limitations: cleaning, laundry, community activity, and occupation.   Personal factors: Behavior pattern, Past/current experiences, Time since onset of injury/illness/exacerbation, and 3+ comorbidities: anxiety, chronic migraine, history of HTN, spondylosis of lumbar region  are also affecting patient's functional outcome.   Rehab Potential: Fair    Clinical decision making: Evolving/moderate complexity  Evaluation complexity: Moderate   GOALS: Goals reviewed with patient? Yes  LONG TERM GOALS: Target date: 06/08/2022  Patient will demonstrate improved function as evidenced by a score of > 55 on  FOTO Urinary Problem measure for full participation in activities at home and in the community.  Baseline: 52; 7/25: 57 Goal status: MET  Patient will demonstrate independence with HEP in order to maximize therapeutic gains and improve carryover from physical therapy sessions to ADLs in the home and community. Baseline: not initiated; 7/25: IND Goal status: MET  Patient will demonstrate improved function as evidenced by a score of <60 on FOTO PFDI Pain measure for full participation in activities at home and in the community. Baseline: 100; 7/25: 54 Goal status: MET  Patient will demonstrate improved function as evidenced by a score of >49 on FOTO Bowel Constipation measure for full participation in activities at home and in the community. Baseline: 45; 7/25: 49 Goal status: NOT MET   PLAN: Rehab frequency: 1x/week  Rehab duration: 8 weeks  Planned interventions: Therapeutic exercises, Therapeutic activity, Neuromuscular re-education, Balance training, Gait training, Patient/Family education, Joint mobilization, Orthotic/Fit training, Electrical stimulation, Spinal mobilization, Cryotherapy, Moist heat, scar mobilization, Taping, and Manual therapy     Myles Gip PT, DPT [phone]  06/07/2022, 10:37 AM

## 2022-06-08 ENCOUNTER — Ambulatory Visit: Payer: Medicaid Other

## 2022-06-08 DIAGNOSIS — M6281 Muscle weakness (generalized): Secondary | ICD-10-CM | POA: Diagnosis not present

## 2022-06-08 DIAGNOSIS — R42 Dizziness and giddiness: Secondary | ICD-10-CM

## 2022-06-08 DIAGNOSIS — R278 Other lack of coordination: Secondary | ICD-10-CM | POA: Diagnosis not present

## 2022-06-16 ENCOUNTER — Ambulatory Visit: Payer: Medicaid Other

## 2022-06-17 ENCOUNTER — Other Ambulatory Visit
Admission: RE | Admit: 2022-06-17 | Discharge: 2022-06-17 | Disposition: A | Discharge status: Home / Self Care | Payer: Medicaid Other | Source: Ambulatory Visit | Attending: Neurology | Admitting: Neurology

## 2022-06-17 DIAGNOSIS — R2990 Unspecified symptoms and signs involving the nervous system: Secondary | ICD-10-CM | POA: Diagnosis present

## 2022-06-17 LAB — APTT: aPTT: 27 seconds (ref 24–36)

## 2022-06-18 IMAGING — MG MM DIGITAL SCREENING BILAT W/ TOMO AND CAD
6 of 10 series · 6 of 30 positions shown · non-contrast
Comparison: Previous exam(s).

CLINICAL DATA: Screening.

EXAM:
DIGITAL SCREENING BILATERAL MAMMOGRAM WITH TOMOSYNTHESIS AND CAD
TECHNIQUE: Bilateral screening digital craniocaudal and mediolateral oblique
mammograms were obtained. Bilateral screening digital breast
tomosynthesis was performed. The images were evaluated with
computer-aided detection.

[L MLO synth-2D]
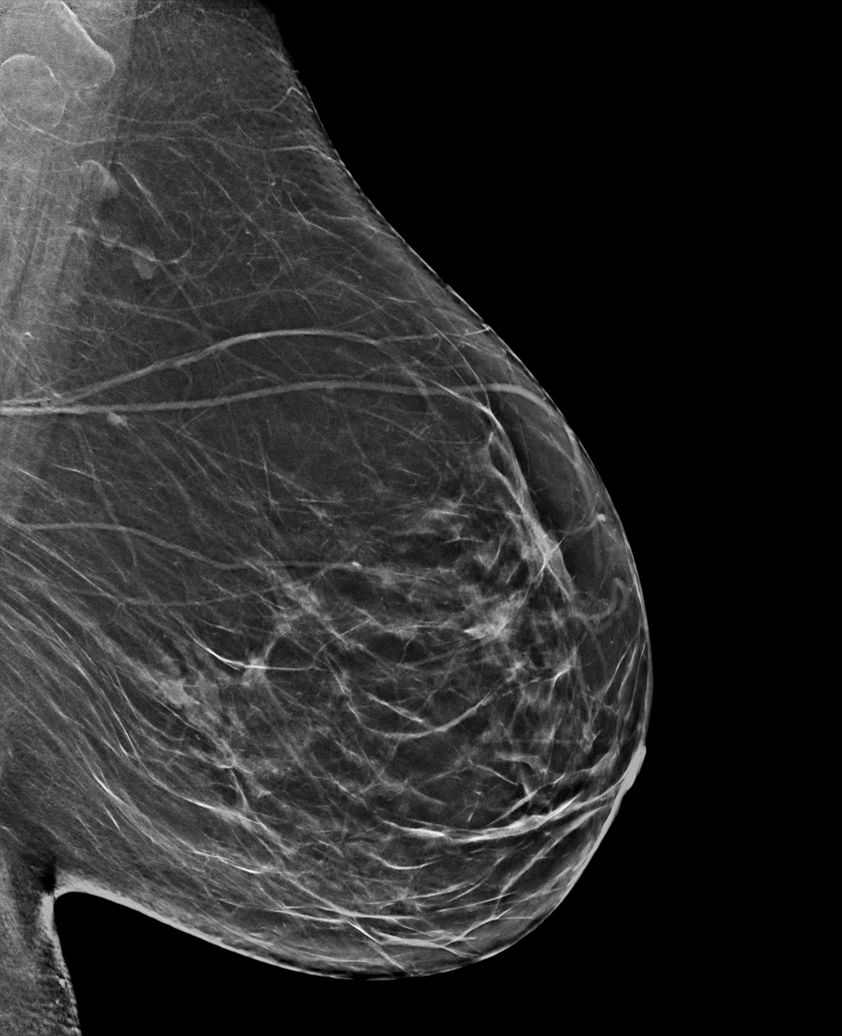

[L CC synth-2D]
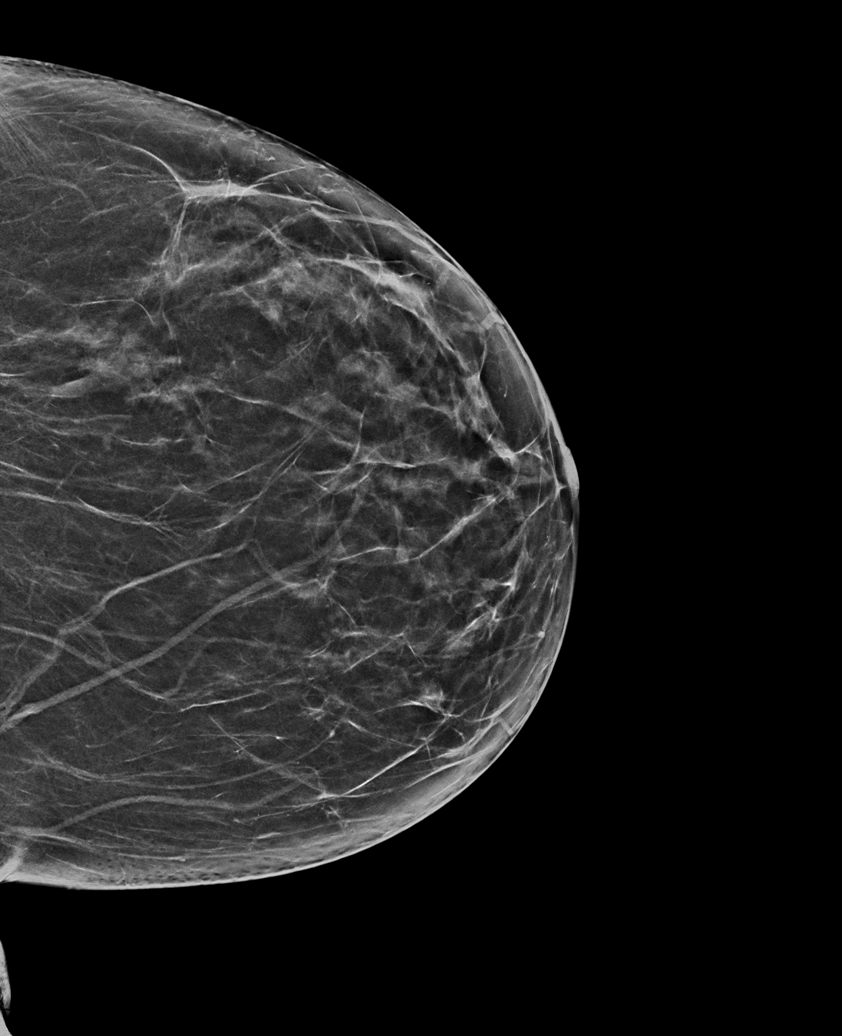

[R MLO synth-2D (1 of 2)]
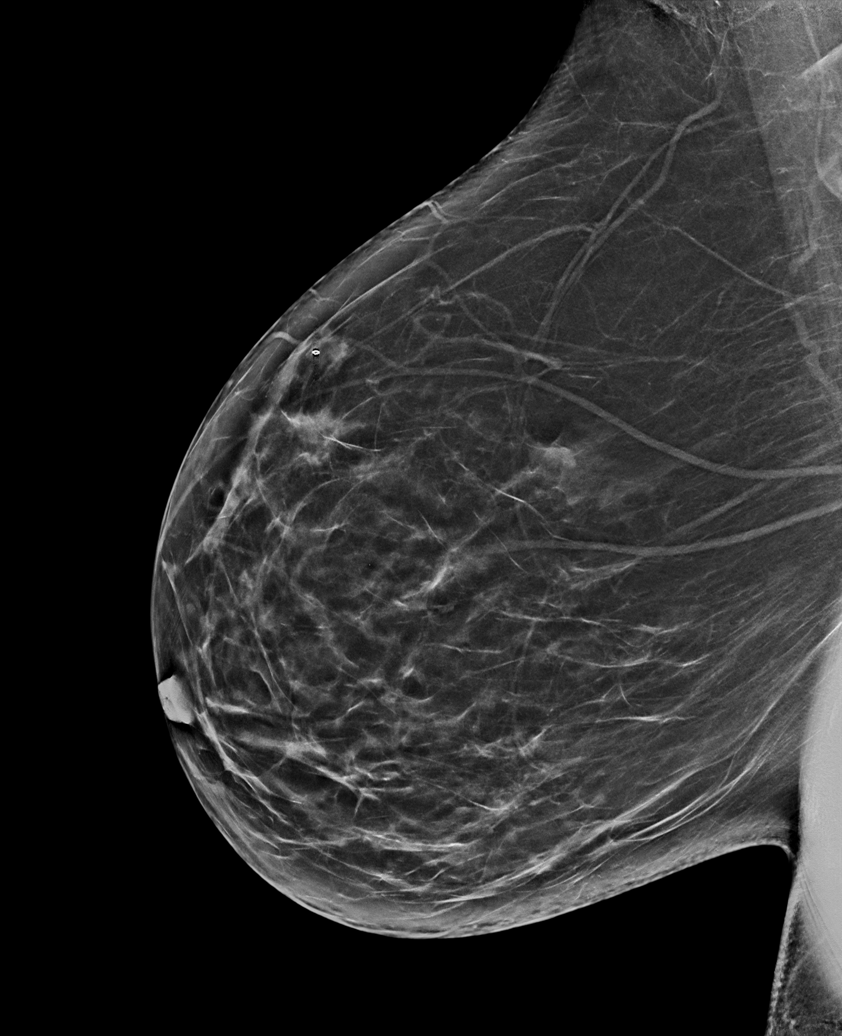

[R MLO synth-2D (2 of 2)]
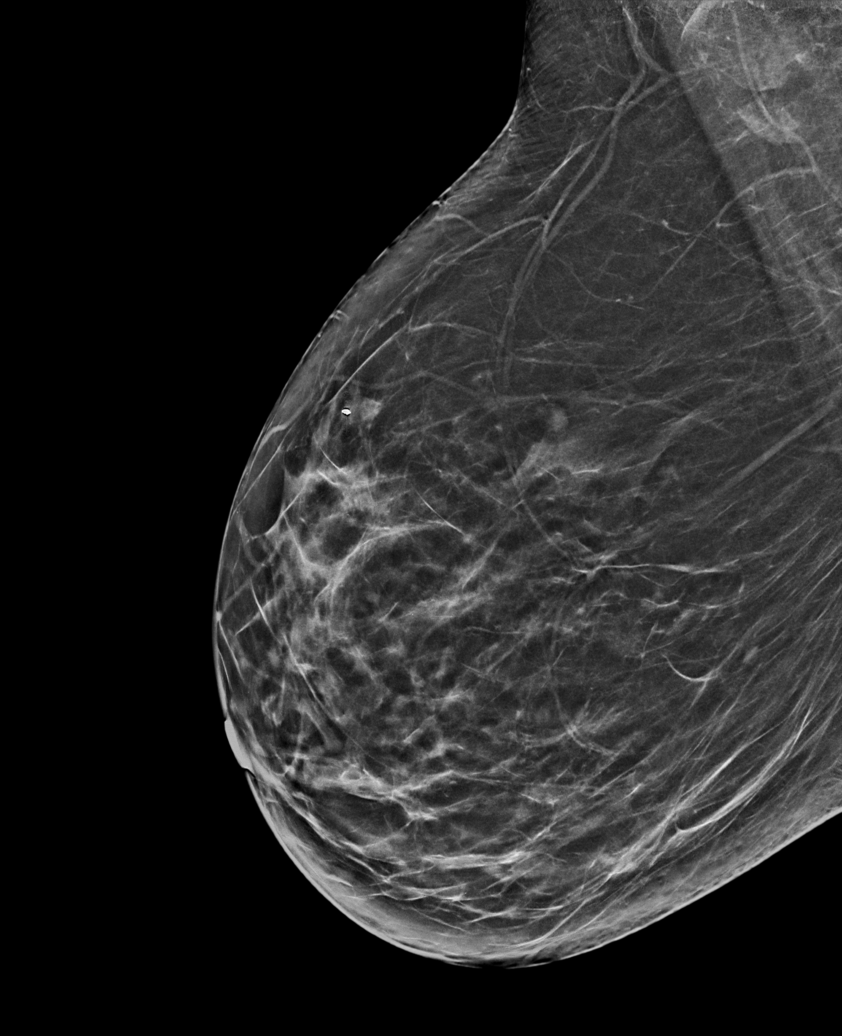

[R CC synth-2D]
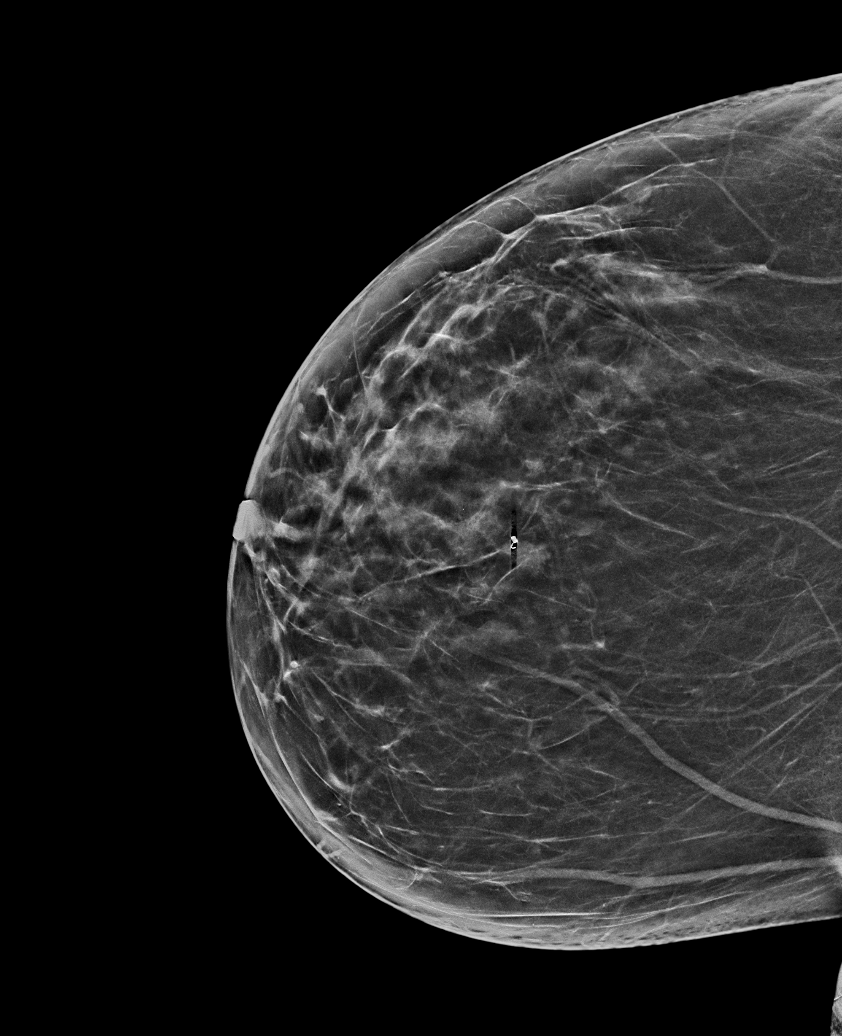

[L CC tomo · tomo slice 31/62.0]
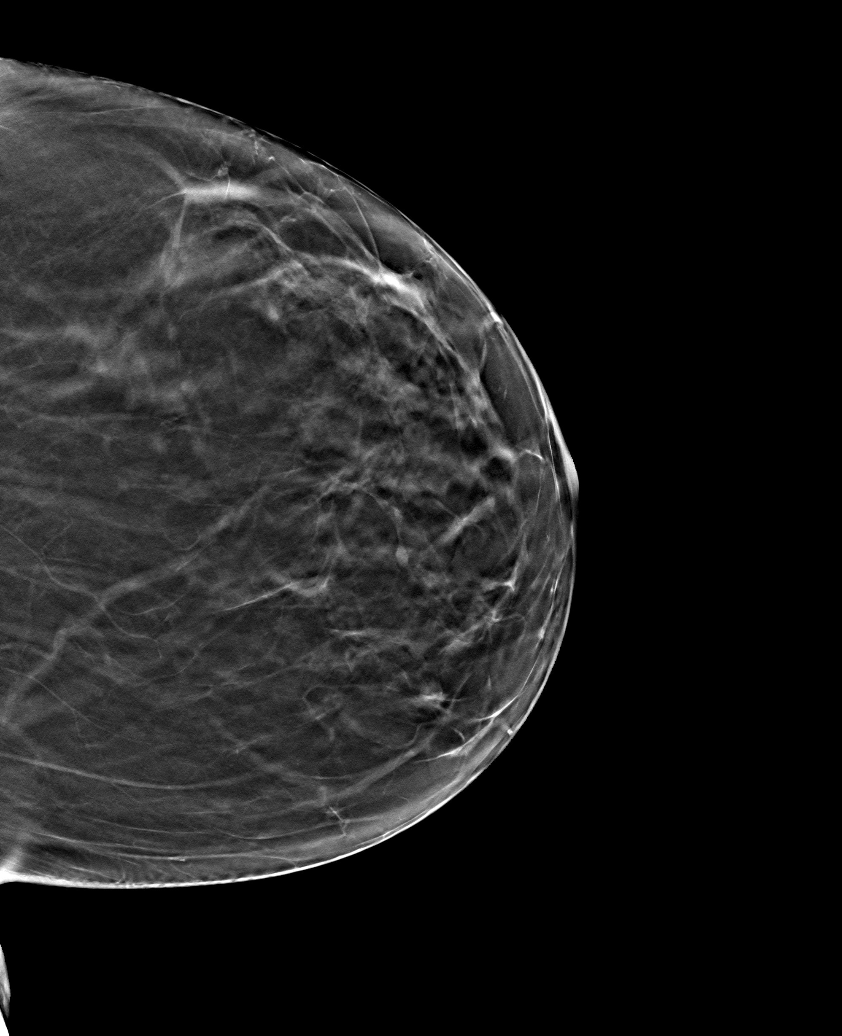

[6 of 30 positions shown; findings below may reference images not displayed]

ACR Breast Density Category b: There are scattered areas of
fibroglandular density.
FINDINGS: There are no findings suspicious for malignancy.
IMPRESSION: No mammographic evidence of malignancy. A result letter of this
screening mammogram will be mailed directly to the patient.

RECOMMENDATION:
Screening mammogram in one year. (Code:51-O-LD2)

BI-RADS CATEGORY  1: Negative.

## 2022-06-20 ENCOUNTER — Other Ambulatory Visit: Payer: Self-pay | Admitting: Neurology

## 2022-06-20 DIAGNOSIS — R2990 Unspecified symptoms and signs involving the nervous system: Secondary | ICD-10-CM

## 2022-06-27 NOTE — Progress Notes (Signed)
Called and left message regarding LP on schedule for 8/18, with arrival time 0830, encouraged patient to call back if questions regarding LP.

## 2022-07-01 ENCOUNTER — Ambulatory Visit
Admission: RE | Admit: 2022-07-01 | Discharge: 2022-07-01 | Disposition: A | Discharge status: Home / Self Care | Payer: Medicaid Other | Source: Ambulatory Visit | Attending: Neurology | Admitting: Neurology

## 2022-07-01 DIAGNOSIS — R2990 Unspecified symptoms and signs involving the nervous system: Secondary | ICD-10-CM | POA: Insufficient documentation

## 2022-07-01 LAB — CSF CELL COUNT WITH DIFFERENTIAL
Eosinophils, CSF: 0 %
Lymphs, CSF: 75 %
Monocyte-Macrophage-Spinal Fluid: 5 %
RBC Count, CSF: 1324 /mm3 — ABNORMAL HIGH (ref 0–3)
Segmented Neutrophils-CSF: 20 %
Tube #: 1
WBC, CSF: 19 /mm3 (ref 0–5)

## 2022-07-01 LAB — GLUCOSE, CSF: Glucose, CSF: 58 mg/dL (ref 40–70)

## 2022-07-01 LAB — PATHOLOGIST SMEAR REVIEW

## 2022-07-01 LAB — PROTEIN, CSF: Total  Protein, CSF: 40 mg/dL (ref 15–45)

## 2022-07-01 MED ORDER — ACETAMINOPHEN 500 MG PO TABS
1000.0000 mg | ORAL_TABLET | Freq: Once | ORAL | Status: AC
  Administered 2022-07-01: 1000 mg via ORAL

## 2022-07-01 MED ORDER — ACETAMINOPHEN 500 MG PO TABS
ORAL_TABLET | ORAL | Status: AC
  Filled 2022-07-01: qty 2

## 2022-07-01 MED ORDER — LIDOCAINE HCL (PF) 1 % IJ SOLN
10.0000 mL | Freq: Once | INTRAMUSCULAR | Status: AC
  Administered 2022-07-01: 5 mL

## 2022-07-01 NOTE — Progress Notes (Signed)
Patient has remained clinically stable post LP, given 1000 mg po tylenol for back pain ealier with stated relief, taking po's without difficulty. Discharge instructions given.

## 2022-07-01 NOTE — Procedures (Addendum)
Fluoro guided LP at L5-S1 level with opening pressure of 15 cm H2O  Closing pressure not obtained. 3 cc of clear, colorless CSF sent to lab for analysis. During procedure, CSF flow ceased.  Needle was repositioned but was unable to obtain further CSF.  Dr. Pascal Lux, IR, attempted at different level, but was unable to obtain CSF.   No immediate post procedural complication.  Pt tolerated procedure well.   Please see imaging section of Epic for full dictation.    Narda Rutherford, AGNP-BC 07/01/2022, 11:28 AM

## 2022-07-01 NOTE — Progress Notes (Addendum)
CRITICAL LAB: CSF, WBC is 19, [employer], treatment team notified Elzie Rings RN and Allegra Lai calling provider)

## 2022-07-04 LAB — IGG CSF INDEX
Albumin CSF-mCnc: 27 mg/dL (ref 8–37)
Albumin: 3.8 g/dL (ref 3.8–4.9)
CSF IgG Index: 0.8 — ABNORMAL HIGH (ref 0.0–0.7)
IgG (Immunoglobin G), Serum: 1037 mg/dL (ref 586–1602)
IgG, CSF: 6.2 mg/dL (ref 0.0–6.7)
IgG/Alb Ratio, CSF: 0.23 (ref 0.00–0.25)

## 2022-07-06 LAB — OLIGOCLONAL BANDS, CSF + SERM

## 2022-11-09 ENCOUNTER — Ambulatory Visit: Payer: Medicaid Other

## 2022-11-10 ENCOUNTER — Ambulatory Visit: Payer: Medicaid Other

## 2022-11-16 ENCOUNTER — Ambulatory Visit: Payer: Medicaid Other | Admitting: Physical Therapy

## 2022-11-21 ENCOUNTER — Encounter: Payer: Self-pay | Admitting: Physical Therapy

## 2022-11-21 ENCOUNTER — Ambulatory Visit: Payer: Medicaid Other | Attending: Psychiatry | Admitting: Physical Therapy

## 2022-11-21 DIAGNOSIS — R262 Difficulty in walking, not elsewhere classified: Secondary | ICD-10-CM | POA: Insufficient documentation

## 2022-11-21 DIAGNOSIS — M6281 Muscle weakness (generalized): Secondary | ICD-10-CM | POA: Insufficient documentation

## 2022-11-21 DIAGNOSIS — R2689 Other abnormalities of gait and mobility: Secondary | ICD-10-CM | POA: Diagnosis not present

## 2022-11-21 NOTE — Therapy (Unsigned)
OUTPATIENT PHYSICAL THERAPY BALANCE EVALUATION   Patient Name: Amy Hubbard MRN: [id number] DOB:[birth year], 52 y.o., female Today's Date: 11/22/2022   PT End of Session - 11/21/22 1735     Visit Number 1    Number of Visits 20    Date for PT Re-Evaluation 01/30/23    Authorization Type eval: 11/21/22    Progress Note Due on Visit 10    PT Start Time 0933    PT Stop Time 1018    PT Time Calculation (min) 45 min    Equipment Utilized During Treatment Gait belt    Activity Tolerance Patient limited by fatigue;Patient tolerated treatment well    Behavior During Therapy WFL for tasks assessed/performed             Past Medical History:  Diagnosis Date   Anxiety    Complication of anesthesia    PONV (postoperative nausea and vomiting)    Uterine fibroid    Past Surgical History:  Procedure Laterality Date   ABDOMINAL HYSTERECTOMY     BREAST BIOPSY Right 03/17/2017   fibroadenoma, bx/clip   CESAREAN SECTION N/A 10/29/2005   IUD REMOVAL     LAPAROSCOPIC VAGINAL HYSTERECTOMY WITH SALPINGECTOMY Bilateral 05/31/2019   Procedure: LAPAROSCOPIC ASSISTED VAGINAL HYSTERECTOMY WITH SALPINGECTOMY;  Surgeon: Boykin Nearing, MD;  Location: [facility] ORS;  Service: Gynecology;  Laterality: Bilateral;   Patient Active Problem List   Diagnosis Date Noted   Postoperative state 05/31/2019    PCP: Barbaraann Boys, MD  REFERRING PROVIDER: Collene Gobble, MD   REFERRING DIAGNOSIS:  G35 (ICD-10-CM) - Multiple sclerosis     THERAPY DIAG: Imbalance  ONSET DATE: 10/07/23  FOLLOW UP APPT WITH PROVIDER: None on file    RATIONALE FOR EVALUATION AND TREATMENT: Rehabilitation  SUBJECTIVE:                                                                                                                                                                                         Chief Complaint: Patient is a 52 year old female referred for impaired gait and balance with history of  MS  Pertinent History Patient is a 52 year old female referred for impaired gait and balance with history of MS. Pt has f/u with nuerology tomorrow. She feels like her symptoms have been more significant since late this past year, most notably with flare-up around Thanksgiving. Pt reports some neck pain, nausea. Pt was seeing Myles Gip, DPT before for pelvic health needs and also underwent previous vestibular evaluation with Roxana Hires, DPT, [facility] prior to her MS diagnosis. Pt reports she has dealt with notable fatigue and has been sleeping more. Pt is undergoing  OCREVUS infusions to treat MS. Pt has migraine disorder and undergoes Aimovig injection for this. Pt reports dizziness with rapid change in head position. Pt reports visual changes recently (some diplopia and difficulty focusing), memory loss. Pt reports intermittent stumbling into her wall when going down hallway.    Pain: Yes, headache and burning diffusely "all over," varies in location  Numbness/Tingling: Yes, numbness/tingling in her hands subtly  Focal Weakness: Yes, sometimes she has intermittent buckling LLE, intermittent loss of grip in her right hand; weakness associated with back pain  Recent changes in overall health/medication: Yes Prior history of physical therapy for balance:  No  Falls: Has patient fallen in last 6 months? No, Number of falls: N/A  Imaging: Yes   MR Brain 05/06/22 Chronic white matter changes stable since 2021. The appearance is nonspecific and could be seen with chronic demyelinating disease or chronic ischemia. Correlate with neurologic symptoms of multiple sclerosis.  Prior level of function: Independent with basic ADLs Occupational demands:Pt out of work now, applying for disability  Hobbies: Able to walk outside, walking to her mailbox  Red flags (bowel/bladder changes, saddle paresthesia, personal history of cancer, h/o spinal tumors, h/o compression fx, h/o abdominal aneurysm, abdominal  pain, chills/fever, night sweats, nausea, vomiting, unrelenting pain): Positive for night pain, history of positive breat biopsy    Precautions: Fall risk  Weight Bearing Restrictions: No  Living Environment Lives with: lives with one of her daughters, pt is close with her neighbors Lives in: House/apartment One step to get in front door; bilateral railings to get up back steps (6-7 steps in back). [place] on her yard Has following equipment at home: Single point cane   Patient Goals: How to manage imbalance issues    OBJECTIVE:   Patient Surveys  FOTO: 53, predicted improvement to 6 ABC: Next visit  Modified Fatigue Impact Scale: Next visit    Cognition Patient is oriented to person, place, and time.  Recent memory is intact.  Remote memory is intact.  Attention span and concentration are intact.  Expressive speech is intact. Patient's fund of knowledge is within normal limits for educational level.     Gross Musculoskeletal Assessment Tremor: None Bulk: Normal Tone: Normal   GAIT: Assistive device utilized: None Level of assistance: CGA Comments: Patient tends to deviate to left during forward gait, shortened step length bilaterally, decreased hip extension, L hip circumduction    Posture: Pt rests in kyphotic posture, posterior pelvic tilt    LE MMT:  MMT (out of 5) Right 11/22/2022 Left 11/22/2022  Hip flexion 4- 4-  Hip extension    Hip abduction 4 4  Hip adduction 5 5  Hip internal rotation    Hip external rotation    Knee flexion 5 5  Knee extension 5 4+  Ankle dorsiflexion 4+ 4+  Ankle plantarflexion    Ankle inversion    Ankle eversion    (* = pain; Blank rows = not tested)   Sensation Impaired light touch medial malleolus/digits in L>RLE to light touch bilateral LEs. Proprioception, and hot/cold testing deferred on this date.   Reflexes Deferred   Cranial Nerves Visual acuity and visual fields are intact  -convergence insufficiency   Extraocular muscles are intact  Facial sensation is intact bilaterally  Facial strength is intact bilaterally  Hearing is normal as tested by gross conversation Palate elevates midline, normal phonation  Shoulder shrug strength is intact  Tongue protrudes deviates to R side    Coordination/Cerebellar Finger to Nose:  Intermittent hypometria  Heel to Shin: WNL Rapid alternating movements: WNL Finger Opposition: WNL Pronator Drift: Negative     FUNCTIONAL OUTCOME MEASURES   Results Comments  BERG Next visit/56 Fall risk, in need of intervention  DGI Next visit/24   TUG 9.9 seconds   5TSTS 21.8 seconds   6 Minute Walk Test Next visit    (Blank rows = not tested)    TODAY'S TREATMENT   Therapeutic Exercise - Discussion on appropriate exercise and appropriate use of AD, PT education  Patient education on current condition, role of PT, prognosis, plan of care. Discussion on use of SPC for community-level mobility and discussion on safe home setup.      PATIENT EDUCATION:  Education details: see above for patient education details Person educated: Patient Education method: Explanation Education comprehension: verbalized understanding   HOME EXERCISE PROGRAM: None formally established    ASSESSMENT:  CLINICAL IMPRESSION: Patient is a 53 y.o. female who was seen today for physical therapy evaluation and treatment for impaired gait and balance with history of MS. Objective impairments include Abnormal gait, decreased activity tolerance, decreased balance, decreased coordination, difficulty walking, decreased strength, and postural dysfunction. Pt demonstrates deviation to left during forward gait and compensatory hip circumduction to clear LLE during swing phase. Sensory changes from MS are also limiting factor for sensory integration required to maintain postural control during standing/ambulatory activity. These impairments are limiting patient from shopping and  community activity. Personal factors including 3+ comorbidities: multiple sclerosis, Hx of migraine disorder, anxiety)  are also affecting patient's functional outcome. Patient will benefit from skilled PT to address above impairments and improve overall function.  REHAB POTENTIAL: Good  CLINICAL DECISION MAKING: Evolving/moderate complexity  EVALUATION COMPLEXITY: Moderate   GOALS: Goals reviewed with patient? No  SHORT TERM GOALS: Target date: 12/13/2022  Pt will be independent with HEP in order to improve strength and balance in order to decrease fall risk and improve function at home. Baseline: 11/21/22: Baseline HEP initiated Goal status: INITIAL    LONG TERM GOALS: Target date: 02/02/2023  Pt will increase FOTO to at least 44 to demonstrate significant improvement in function at home related to balance  Baseline: 11/21/22: 34 Goal status: INITIAL  2.  Pt will improve BERG by at least 3 points in order to demonstrate clinically significant improvement in balance.   Baseline: 11/21/22: To be obtained on visit # 2 Goal status: INITIAL  3.  Pt will improve ABC by at least 13% in order to demonstrate clinically significant improvement in balance confidence.      Baseline: 11/21/22: To be obtained on visit # 2 Goal status: INITIAL  4. Pt will decrease 5TSTS by at least 3 seconds in order to demonstrate clinically significant improvement in LE strength      Baseline: 11/21/22: 21.8 seconds Goal status: INITIAL  5. Pt will improve DGI by at least 3 points in order to demonstrate clinically significant improvement in balance and decreased risk for falls.     Baseline: 11/21/22: To be performed on visit # 2 Goal status: INITIAL  6. Pt will increase 6MWT by at least 78m(1637f in order to demonstrate clinically significant improvement in cardiopulmonary endurance and community-level ambulation   Baseline: 11/21/22: To be performed on visit # 2.  Goal status: INITIAL   PLAN: PT FREQUENCY:  2x/week  PT DURATION: 8-10 weeks  PLANNED INTERVENTIONS: Therapeutic exercises, Therapeutic activity, Neuromuscular re-education, Balance training, Gait training, Patient/Family education, Electrical stimulation, Cryotherapy, and Manual therapy  PLAN FOR NEXT SESSION: Complete BERG, DGI, 6-minute walk test, modified fatigue impact scale. Continue with LE strengthening with emphasis on anti-gravity mm and hip abductors. Progress with gait stability training and balance drills as able. *Precaution with evoking significant fatigue.     Valentina Gu, PT, DPT #[id number]  Eilleen Kempf 11/22/2022, 12:06 PM

## 2022-11-23 ENCOUNTER — Ambulatory Visit: Payer: Medicaid Other | Admitting: Physical Therapy

## 2022-11-23 DIAGNOSIS — R2689 Other abnormalities of gait and mobility: Secondary | ICD-10-CM | POA: Diagnosis not present

## 2022-11-23 NOTE — Therapy (Signed)
OUTPATIENT PHYSICAL THERAPY TREATMENT NOTE   Patient Name: Amy Hubbard MRN: [id number] DOB:1971/05/19, 52 y.o., female Today's Date: 11/23/2022  PCP: Barbaraann Boys, MD  REFERRING PROVIDER: Collene Gobble, MD   END OF SESSION:   PT End of Session - 11/23/22 0943     Visit Number 2    Number of Visits 20    Date for PT Re-Evaluation 01/30/23    Authorization Type eval: 11/21/22    Progress Note Due on Visit 10    PT Start Time 0933    PT Stop Time 1018    PT Time Calculation (min) 45 min    Equipment Utilized During Treatment Gait belt    Activity Tolerance Patient limited by fatigue;Patient tolerated treatment well    Behavior During Therapy WFL for tasks assessed/performed             Past Medical History:  Diagnosis Date   Anxiety    Complication of anesthesia    PONV (postoperative nausea and vomiting)    Uterine fibroid    Past Surgical History:  Procedure Laterality Date   ABDOMINAL HYSTERECTOMY     BREAST BIOPSY Right 03/17/2017   fibroadenoma, bx/clip   CESAREAN SECTION N/A 10/29/2005   IUD REMOVAL     LAPAROSCOPIC VAGINAL HYSTERECTOMY WITH SALPINGECTOMY Bilateral 05/31/2019   Procedure: LAPAROSCOPIC ASSISTED VAGINAL HYSTERECTOMY WITH SALPINGECTOMY;  Surgeon: Boykin Nearing, MD;  Location: [facility] ORS;  Service: Gynecology;  Laterality: Bilateral;   Patient Active Problem List   Diagnosis Date Noted   Postoperative state 05/31/2019    REFERRING DIAG: G35 (ICD-10-CM) - Multiple sclerosis   THERAPY DIAG:  Imbalance  Rationale for Evaluation and Treatment Rehabilitation  PERTINENT HISTORY: Patient is a 52 year old female referred for impaired gait and balance with history of MS. Pt has f/u with neurology tomorrow. She feels like her symptoms have been more significant since late this past year, most notably with flare-up around Thanksgiving. Pt reports some neck pain, nausea. Pt was seeing Myles Gip, DPT before for pelvic health needs and  also underwent previous vestibular evaluation with Roxana Hires, DPT, [facility] prior to her MS diagnosis. Pt reports she has dealt with notable fatigue and has been sleeping more. Pt is undergoing OCREVUS infusions to treat MS. Pt has migraine disorder and undergoes Aimovig injection for this. Pt reports dizziness with rapid change in head position. Pt reports visual changes recently (some diplopia and difficulty focusing), memory loss. Pt reports intermittent stumbling into her wall when going down hallway.      Pain: Yes, headache and burning diffusely "all over," varies in location  Numbness/Tingling: Yes, numbness/tingling in her hands subtly  Focal Weakness: Yes, sometimes she has intermittent buckling LLE, intermittent loss of grip in her right hand; weakness associated with back pain  Recent changes in overall health/medication: Yes Prior history of physical therapy for balance:  No  Falls: Has patient fallen in last 6 months? No, Number of falls: N/A   Imaging: Yes    MR Brain 05/06/22 Chronic white matter changes stable since 2021. The appearance is nonspecific and could be seen with chronic demyelinating disease or chronic ischemia. Correlate with neurologic symptoms of multiple sclerosis.   Prior level of function: Independent with basic ADLs Occupational demands:Pt out of work now, applying for disability  Hobbies: Able to walk outside, walking to her mailbox   Red flags (bowel/bladder changes, saddle paresthesia, personal history of cancer, h/o spinal tumors, h/o compression fx, h/o abdominal aneurysm, abdominal pain, chills/fever,  night sweats, nausea, vomiting, unrelenting pain): Positive for night pain, history of positive breat biopsy      Weight Bearing Restrictions: No   Living Environment Lives with: lives with one of her daughters, pt is close with her neighbors Lives in: House/apartment One step to get in front door; bilateral railings to get up back steps (6-7 steps in  back). [place] on her yard Has following equipment at home: Single point cane     Patient Goals: How to manage imbalance issues    PRECAUTIONS: Fall risk     SUBJECTIVE:                                                                                                                                                                                      SUBJECTIVE STATEMENT:  Patient reports notable headache and paracervical pain the previous day that improved with use of Ibuprofen. She reports mild "pressure" remaining along frontal region. Pt reports having notable comorbid back pain yesterday - it feels notably sore at arrival this AM. She reports compliance with initial HEP.    PAIN:  Are you having pain? Yes Pain location: R iliolumbar/R lumbar flank    OBJECTIVE: (objective measures completed at initial evaluation unless otherwise dated)  Surveys FOTO: 93, predicted improvement to 19 ABC: 43.1% Modified Fatigue Impact Scale: 83/84      GAIT: Assistive device utilized: None Level of assistance: CGA Comments: Patient tends to deviate to left during forward gait, shortened step length bilaterally, decreased hip extension, L hip circumduction      Posture: Pt rests in kyphotic posture, posterior pelvic tilt      LE MMT:   MMT (out of 5) Right 11/22/2022 Left 11/22/2022  Hip flexion 4- 4-  Hip extension      Hip abduction 4 4  Hip adduction 5 5  Hip internal rotation      Hip external rotation      Knee flexion 5 5  Knee extension 5 4+  Ankle dorsiflexion 4+ 4+  Ankle plantarflexion      Ankle inversion      Ankle eversion      (* = pain; Blank rows = not tested)     Sensation Impaired light touch medial malleolus/digits in L>RLE to light touch bilateral LEs. Proprioception, and hot/cold testing deferred on this date.     Reflexes Deferred     Cranial Nerves Visual acuity and visual fields are intact  -convergence insufficiency  Extraocular muscles are  intact  Facial sensation is intact bilaterally  Facial strength is intact bilaterally  Hearing is normal as tested by gross conversation Palate elevates midline, normal phonation  Shoulder shrug strength is intact  Tongue protrudes deviates to R side      Coordination/Cerebellar Finger to Nose: Intermittent hypometria  Heel to Shin: WNL Rapid alternating movements: WNL Finger Opposition: WNL Pronator Drift: Negative         FUNCTIONAL OUTCOME MEASURES     Results Comments  BERG 11/23/22: 51/56 Fall risk, in need of intervention  DGI 11/23/22: 16/24    TUG 11/23/22: 9.9 seconds    5TSTS 11/23/22: 21.8 seconds    6 Minute Walk Test 11/23/22: 735 ft    (Blank rows = not tested)     [facility] PT Assessment - 11/23/22 0001       Standardized Balance Assessment   Standardized Balance Assessment Berg Balance Test      Berg Balance Test   Sit to Stand Able to stand  independently using hands    Standing Unsupported Able to stand 2 minutes with supervision    Sitting with Back Unsupported but Feet Supported on Floor or Stool Able to sit safely and securely 2 minutes    Stand to Sit Sits safely with minimal use of hands    Transfers Able to transfer safely, minor use of hands    Standing Unsupported with Eyes Closed Able to stand 10 seconds safely    Standing Unsupported with Feet Together Able to place feet together independently and stand 1 minute safely    From Standing, Reach Forward with Outstretched Arm Can reach confidently >25 cm (10")    From Standing Position, Pick up Object from Floor Able to pick up shoe, needs supervision    From Standing Position, Turn to Look Behind Over each Shoulder Looks behind from both sides and weight shifts well    Turn 360 Degrees Able to turn 360 degrees safely in 4 seconds or less    Standing Unsupported, Alternately Place Feet on Step/Stool Able to stand independently and safely and complete 8 steps in 20 seconds    Standing Unsupported, One  Foot in Front Able to place foot tandem independently and hold 30 seconds    Standing on One Leg Able to lift leg independently and hold equal to or more than 3 seconds    Total Score 51                 TODAY'S TREATMENT     Patient completed Modified Fatigue Impact Scale and ABC Scale (see scores above)   Neuromuscular Re-education - for improved sensory integration, static and dynamic postural control, equilibrium and non-equilibrium coordination as needed for negotiating home and community environment and stepping over obstacles   Performance of BERG, DGI    Therapeutic Exercise - improved cardiovascular/neuromuscular endurance to improve community-level mobility, improved strength as needed to improve performance of CKC activities/functional movements and as needed for power production to prevent fall during episode of large postural perturbation     Performance of 6-minute walk test   PATIENT EDUCATION: Discussed role of functional outcome measures and explained results. Discussed strategies for energy conservation to reduce likelihood of significant fatigue and flare-up of symptoms with ADLs.      MHP (unbilled) utilized at end of session for analgesic effect and improved soft tissue extensibility, pt seated with MHP along low back; x 3 minutes   -Shorter duration due to patient having to leave for  medical transport       PATIENT EDUCATION:  Education details: see above for patient education details Person educated: Patient Education method: Explanation Education comprehension:  verbalized understanding     HOME EXERCISE PROGRAM: None formally established      ASSESSMENT:   CLINICAL IMPRESSION:  Patient arrives with excellent motivation to participate in physical therapy. Today's session focused on performance of functional outcome measures and completing intake related to patient's current functional impairments with context of MS. Patient has good  baseline BERG score beyond established cut-off score for fall risk. DGI is below fall risk cut-off. Pt does have notable fatigue toward end of 6-minute walk test and has significant impact of fatigue on ADLs per MFIS taken today. Patient has remaining deficits in LE weakness, impaired sensation and sensory integration, impaired postural control, impaired gait stability with intermittent lateral staggering and deviation to L during forward gait. Patient will benefit from continued skilled therapeutic intervention to address the above deficits as needed for improved function and QoL.     REHAB POTENTIAL: Good   CLINICAL DECISION MAKING: Evolving/moderate complexity   EVALUATION COMPLEXITY: Moderate     GOALS: Goals reviewed with patient? No   SHORT TERM GOALS: Target date: 12/13/2022   Pt will be independent with HEP in order to improve strength and balance in order to decrease fall risk and improve function at home. Baseline: 11/21/22: Baseline HEP initiated Goal status: INITIAL       LONG TERM GOALS: Target date: 02/02/2023   Pt will increase FOTO to at least 44 to demonstrate significant improvement in function at home related to balance  Baseline: 11/21/22: 34 Goal status: INITIAL   2.  Pt will improve BERG by at least 3 points in order to demonstrate clinically significant improvement in balance.   Baseline: 11/21/22: To be obtained on visit # 2.       11/23/22: 51/56 Goal status: INITIAL   3.  Pt will improve ABC by at least 13% in order to demonstrate clinically significant improvement in balance confidence.      Baseline: 11/21/22: To be obtained on visit # 2.    11/23/22: 43.1% Goal status: INITIAL   4. Pt will decrease 5TSTS by at least 3 seconds in order to demonstrate clinically significant improvement in LE strength      Baseline: 11/21/22: 21.8 seconds.   Goal status: INITIAL   5. Pt will improve DGI by at least 3 points in order to demonstrate clinically significant improvement  in balance and decreased risk for falls.     Baseline: 11/21/22: To be performed on visit # 2.   11/23/22: 16/24 Goal status: INITIAL   6. Pt will increase 6MWT by at least 8m(1644f in order to demonstrate clinically significant improvement in cardiopulmonary endurance and community-level ambulation   Baseline: 11/21/22: To be performed on visit # 2.   11/23/22: 735 ft  Goal status: INITIAL     PLAN: PT FREQUENCY: 2x/week   PT DURATION: 8-10 weeks   PLANNED INTERVENTIONS: Therapeutic exercises, Therapeutic activity, Neuromuscular re-education, Balance training, Gait training, Patient/Family education, Electrical stimulation, Cryotherapy, and Manual therapy   PLAN FOR NEXT SESSION: Continue with LE strengthening with emphasis on anti-gravity mm and hip abductors. Progress with gait stability training and balance drills as able. *Precaution with evoking significant fatigue.     JeValentina GuPT, DPT #[id number] KempfPT 11/23/2022, 9:44 AM

## 2022-11-28 ENCOUNTER — Encounter: Payer: Medicaid Other | Admitting: Physical Therapy

## 2022-11-30 ENCOUNTER — Ambulatory Visit: Payer: Medicaid Other | Admitting: Physical Therapy

## 2022-11-30 ENCOUNTER — Encounter: Payer: Self-pay | Admitting: Physical Therapy

## 2022-11-30 DIAGNOSIS — R2689 Other abnormalities of gait and mobility: Secondary | ICD-10-CM | POA: Diagnosis not present

## 2022-11-30 NOTE — Therapy (Signed)
OUTPATIENT PHYSICAL THERAPY TREATMENT NOTE   Patient Name: Amy Hubbard MRN: [id number] DOB:02-15-1971, 52 y.o., female Today's Date: 11/30/2022  PCP: Barbaraann Boys, MD  REFERRING PROVIDER: Collene Gobble, MD   END OF SESSION:   PT End of Session - 11/30/22 0927     Visit Number 3    Number of Visits 20    Date for PT Re-Evaluation 01/30/23    Authorization Type eval: 11/21/22    Progress Note Due on Visit 10    PT Start Time 0927    PT Stop Time 1012    PT Time Calculation (min) 45 min    Equipment Utilized During Treatment Gait belt    Activity Tolerance Patient limited by fatigue;Patient tolerated treatment well    Behavior During Therapy WFL for tasks assessed/performed              Past Medical History:  Diagnosis Date   Anxiety    Complication of anesthesia    PONV (postoperative nausea and vomiting)    Uterine fibroid    Past Surgical History:  Procedure Laterality Date   ABDOMINAL HYSTERECTOMY     BREAST BIOPSY Right 03/17/2017   fibroadenoma, bx/clip   CESAREAN SECTION N/A 10/29/2005   IUD REMOVAL     LAPAROSCOPIC VAGINAL HYSTERECTOMY WITH SALPINGECTOMY Bilateral 05/31/2019   Procedure: LAPAROSCOPIC ASSISTED VAGINAL HYSTERECTOMY WITH SALPINGECTOMY;  Surgeon: Boykin Nearing, MD;  Location: [facility] OR[facility];  Service: Gynecology;  Laterality: Bilateral;   Patient Active Problem List   Diagnosis Date Noted   Postoperative state 05/31/2019    REFERRING DIAG: G35 (ICD-10-CM) - Multiple sclerosis   THERAPY DIAG:  Imbalance  Rationale for Evaluation and Treatment Rehabilitation  PERTINENT HISTORY: Patient is a 52 year old female referred for impaired gait and balance with history of MS. Pt has f/u with neurology tomorrow. She feels like her symptoms have been more significant since late this past year, most notably with flare-up around Thanksgiving. Pt reports some neck pain, nausea. Pt was seeing Myles Gip, DPT before for pelvic health needs and  also underwent previous vestibular evaluation with Roxana Hires, DPT, [facility] prior to her MS diagnosis. Pt reports she has dealt with notable fatigue and has been sleeping more. Pt is undergoing OCREVUS infusions to treat MS. Pt has migraine disorder and undergoes Aimovig injection for this. Pt reports dizziness with rapid change in head position. Pt reports visual changes recently (some diplopia and difficulty focusing), memory loss. Pt reports intermittent stumbling into her wall when going down hallway.      Pain: Yes, headache and burning diffusely "all over," varies in location  Numbness/Tingling: Yes, numbness/tingling in her hands subtly  Focal Weakness: Yes, sometimes she has intermittent buckling LLE, intermittent loss of grip in her right hand; weakness associated with back pain  Recent changes in overall health/medication: Yes Prior history of physical therapy for balance:  No  Falls: Has patient fallen in last 6 months? No, Number of falls: N/A   Imaging: Yes    MR Brain 05/06/22 Chronic white matter changes stable since 2021. The appearance is nonspecific and could be seen with chronic demyelinating disease or chronic ischemia. Correlate with neurologic symptoms of multiple sclerosis.   Prior level of function: Independent with basic ADLs Occupational demands:Pt out of work now, applying for disability  Hobbies: Able to walk outside, walking to her mailbox   Red flags (bowel/bladder changes, saddle paresthesia, personal history of cancer, h/o spinal tumors, h/o compression fx, h/o abdominal aneurysm, abdominal pain,  chills/fever, night sweats, nausea, vomiting, unrelenting pain): Positive for night pain, history of positive breat biopsy      Weight Bearing Restrictions: No   Living Environment Lives with: lives with one of her daughters, pt is close with her neighbors Lives in: House/apartment One step to get in front door; bilateral railings to get up back steps (6-7 steps in  back). [place] on her yard Has following equipment at home: Single point cane     Patient Goals: How to manage imbalance issues    PRECAUTIONS: Fall risk     SUBJECTIVE:                                                                                                                                                                                      SUBJECTIVE STATEMENT:  Patient reports notable headache and paracervical pain the previous day that improved with use of Ibuprofen. She reports mild "pressure" remaining along frontal region. Pt reports having notable comorbid back pain yesterday - it feels notably sore at arrival this AM. She reports compliance with initial HEP.    PAIN:  Are you having pain? Yes Pain location: R iliolumbar/R lumbar flank    OBJECTIVE: (objective measures completed at initial evaluation unless otherwise dated)  Surveys FOTO: 46, predicted improvement to 93 ABC: 43.1% Modified Fatigue Impact Scale: 83/84      GAIT: Assistive device utilized: None Level of assistance: CGA Comments: Patient tends to deviate to left during forward gait, shortened step length bilaterally, decreased hip extension, L hip circumduction      Posture: Pt rests in kyphotic posture, posterior pelvic tilt      LE MMT:   MMT (out of 5) Right 11/22/2022 Left 11/22/2022  Hip flexion 4- 4-  Hip extension      Hip abduction 4 4  Hip adduction 5 5  Hip internal rotation      Hip external rotation      Knee flexion 5 5  Knee extension 5 4+  Ankle dorsiflexion 4+ 4+  Ankle plantarflexion      Ankle inversion      Ankle eversion      (* = pain; Blank rows = not tested)     Sensation Impaired light touch medial malleolus/digits in L>RLE to light touch bilateral LEs. Proprioception, and hot/cold testing deferred on this date.     Reflexes Deferred     Cranial Nerves Visual acuity and visual fields are intact  -convergence insufficiency  Extraocular muscles are  intact  Facial sensation is intact bilaterally  Facial strength is intact bilaterally  Hearing is normal as tested by gross conversation Palate elevates midline, normal  phonation  Shoulder shrug strength is intact  Tongue protrudes deviates to R side      Coordination/Cerebellar Finger to Nose: Intermittent hypometria  Heel to Shin: WNL Rapid alternating movements: WNL Finger Opposition: WNL Pronator Drift: Negative        FUNCTIONAL OUTCOME MEASURES     Results Comments  BERG 11/23/22: 51/56 Fall risk, in need of intervention  DGI 11/23/22: 16/24    TUG 11/23/22: 9.9 seconds    5TSTS 11/23/22: 21.8 seconds    6 Minute Walk Test 11/23/22: 735 ft    (Blank rows = not tested)         TODAY'S TREATMENT      Neuromuscular Re-education - for improved sensory integration, dynamic balance, equilibrium and non-equilibrium coordination as needed for negotiating home and community environment and stepping over obstacles   Forward hurdle step, (3) 6-inch hurdles consecutive; x5 D/B  -utilize 12-inch hurdles next visit  Forward step-up to [employer]; 2x10 with bilateral LE     Therapeutic Exercise - improved cardiovascular/neuromuscular endurance to improve community-level mobility, improved strength as needed to improve performance of CKC activities/functional movements and as needed for power production to prevent fall during episode of large postural perturbation    *Reviewed 0-10 VAS for fatigue and ensured fatigue remained at 2-3 or below throughout exercise performed. Seated rest breaks were allowed in between sets and exercises to minimize onset of neuromuscular fatigue    NuStep; Level 3, x 5 minutes, seat at 8  -subjective information gathered during this time   Sit to stand; 2x10, from standard chair with arms crossed over chest  3-way hip adjacent to treadmill armrest; 2x8 ea LE  Forward step-up; 6-inch step; 1x10 with each LE, no UE support on handrails   PATIENT  EDUCATION: Updated and reviewed formal HEP; pt given [employer] handout.      PATIENT EDUCATION:  Education details: see above for patient education details Person educated: Patient Education method: Explanation Education comprehension: verbalized understanding     HOME EXERCISE PROGRAM:      ASSESSMENT:   CLINICAL IMPRESSION:  Patient is minimally challenged with hurdle stepping task today and does exhibit good postural control considering her current health condition with various symptoms at this time related to [place]. HEP was updated today with safe drills that could be performed around patient's home. Fatigue was monitored throughout session to ensure it remained on low end of numeric scale. Patient has remaining deficits in LE weakness, impaired sensation and sensory integration, impaired postural control, impaired gait stability with intermittent lateral staggering and deviation to L during forward gait. Patient will benefit from continued skilled therapeutic intervention to address the above deficits as needed for improved function and QoL.     REHAB POTENTIAL: Good   CLINICAL DECISION MAKING: Evolving/moderate complexity   EVALUATION COMPLEXITY: Moderate     GOALS: Goals reviewed with patient? No   SHORT TERM GOALS: Target date: 12/13/2022   Pt will be independent with HEP in order to improve strength and balance in order to decrease fall risk and improve function at home. Baseline: 11/21/22: Baseline HEP initiated Goal status: INITIAL       LONG TERM GOALS: Target date: 02/02/2023   Pt will increase FOTO to at least 44 to demonstrate significant improvement in function at home related to balance  Baseline: 11/21/22: 34 Goal status: INITIAL   2.  Pt will improve BERG by at least 3 points in order to demonstrate clinically significant improvement in balance.  Baseline: 11/21/22: To be obtained on visit # 2.       11/23/22: 51/56 Goal status: INITIAL   3.  Pt will improve  ABC by at least 13% in order to demonstrate clinically significant improvement in balance confidence.      Baseline: 11/21/22: To be obtained on visit # 2.    11/23/22: 43.1% Goal status: INITIAL   4. Pt will decrease 5TSTS by at least 3 seconds in order to demonstrate clinically significant improvement in LE strength      Baseline: 11/21/22: 21.8 seconds.   Goal status: INITIAL   5. Pt will improve DGI by at least 3 points in order to demonstrate clinically significant improvement in balance and decreased risk for falls.     Baseline: 11/21/22: To be performed on visit # 2.   11/23/22: 16/24 Goal status: INITIAL   6. Pt will increase 6MWT by at least 53m(1676f in order to demonstrate clinically significant improvement in cardiopulmonary endurance and community-level ambulation   Baseline: 11/21/22: To be performed on visit # 2.   11/23/22: 735 ft  Goal status: INITIAL     PLAN: PT FREQUENCY: 2x/week   PT DURATION: 8-10 weeks   PLANNED INTERVENTIONS: Therapeutic exercises, Therapeutic activity, Neuromuscular re-education, Balance training, Gait training, Patient/Family education, Electrical stimulation, Cryotherapy, and Manual therapy   PLAN FOR NEXT SESSION: Continue with LE strengthening with emphasis on anti-gravity mm and hip abductors. Progress with gait stability training and balance drills as able. *Precaution with evoking significant fatigue.     JeValentina GuPT, DPT #[id number] KempfPT 11/30/2022, 10:28 AM

## 2022-12-05 ENCOUNTER — Ambulatory Visit: Payer: Medicaid Other | Admitting: Physical Therapy

## 2022-12-05 DIAGNOSIS — R2689 Other abnormalities of gait and mobility: Secondary | ICD-10-CM

## 2022-12-05 NOTE — Therapy (Signed)
OUTPATIENT PHYSICAL THERAPY TREATMENT NOTE   Patient Name: Amy Hubbard MRN: [id number] DOB:30-Jun-1971, 52 y.o., female Today's Date: 12/05/2022  PCP: Barbaraann Boys, MD  REFERRING PROVIDER: Collene Gobble, MD   END OF SESSION:   PT End of Session - 12/05/22 1125     Visit Number 4    Number of Visits 20    Date for PT Re-Evaluation 01/30/23    Authorization Type eval: 11/21/22    Progress Note Due on Visit 10    PT Start Time 0846    PT Stop Time 0931    PT Time Calculation (min) 45 min    Equipment Utilized During Treatment Gait belt    Activity Tolerance Patient limited by fatigue;Patient tolerated treatment well    Behavior During Therapy WFL for tasks assessed/performed               Past Medical History:  Diagnosis Date   Anxiety    Complication of anesthesia    PONV (postoperative nausea and vomiting)    Uterine fibroid    Past Surgical History:  Procedure Laterality Date   ABDOMINAL HYSTERECTOMY     BREAST BIOPSY Right 03/17/2017   fibroadenoma, bx/clip   CESAREAN SECTION N/A 10/29/2005   IUD REMOVAL     LAPAROSCOPIC VAGINAL HYSTERECTOMY WITH SALPINGECTOMY Bilateral 05/31/2019   Procedure: LAPAROSCOPIC ASSISTED VAGINAL HYSTERECTOMY WITH SALPINGECTOMY;  Surgeon: Boykin Nearing, MD;  Location: [facility];  Service: Gynecology;  Laterality: Bilateral;   Patient Active Problem List   Diagnosis Date Noted   Postoperative state 05/31/2019    REFERRING DIAG: G35 (ICD-10-CM) - Multiple sclerosis   THERAPY DIAG:  Imbalance  Rationale for Evaluation and Treatment Rehabilitation  PERTINENT HISTORY: Patient is a 52 year old female referred for impaired gait and balance with history of MS. Pt has f/u with neurology tomorrow. She feels like her symptoms have been more significant since late this past year, most notably with flare-up around Thanksgiving. Pt reports some neck pain, nausea. Pt was seeing Myles Gip, DPT before for pelvic health needs and  also underwent previous vestibular evaluation with Roxana Hires, DPT, [facility] prior to her MS diagnosis. Pt reports she has dealt with notable fatigue and has been sleeping more. Pt is undergoing OCREVUS infusions to treat MS. Pt has migraine disorder and undergoes Aimovig injection for this. Pt reports dizziness with rapid change in head position. Pt reports visual changes recently (some diplopia and difficulty focusing), memory loss. Pt reports intermittent stumbling into her wall when going down hallway.      Pain: Yes, headache and burning diffusely "all over," varies in location  Numbness/Tingling: Yes, numbness/tingling in her hands subtly  Focal Weakness: Yes, sometimes she has intermittent buckling LLE, intermittent loss of grip in her right hand; weakness associated with back pain  Recent changes in overall health/medication: Yes Prior history of physical therapy for balance:  No  Falls: Has patient fallen in last 6 months? No, Number of falls: N/A   Imaging: Yes    MR Brain 05/06/22 Chronic white matter changes stable since 2021. The appearance is nonspecific and could be seen with chronic demyelinating disease or chronic ischemia. Correlate with neurologic symptoms of multiple sclerosis.   Prior level of function: Independent with basic ADLs Occupational demands:Pt out of work now, applying for disability  Hobbies: Able to walk outside, walking to her mailbox   Red flags (bowel/bladder changes, saddle paresthesia, personal history of cancer, h/o spinal tumors, h/o compression fx, h/o abdominal aneurysm, abdominal  pain, chills/fever, night sweats, nausea, vomiting, unrelenting pain): Positive for night pain, history of positive breat biopsy      Weight Bearing Restrictions: No   Living Environment Lives with: lives with one of her daughters, pt is close with her neighbors Lives in: House/apartment One step to get in front door; bilateral railings to get up back steps (6-7 steps in  back). [place] on her yard Has following equipment at home: Single point cane     Patient Goals: How to manage imbalance issues    PRECAUTIONS: Fall risk     SUBJECTIVE:                                                                                                                                                                                      SUBJECTIVE STATEMENT:  Patient reports having notable pain between Thursday-Friday and the weekend. She reports pain affecting R shoulder/arm and RLE with feeling of "heaviness" and numbness/tingling down her RLE. Pt does have history of sciatic-type symptoms and R shoulder injury for which she sought previous treatment. She felt she tolerated exercise last visit well, and she was having pain prior to beginning last visit as well.    PAIN:  Are you having pain? Yes Pain location: R iliolumbar/R lumbar flank    OBJECTIVE: (objective measures completed at initial evaluation unless otherwise dated)  Surveys FOTO: 17, predicted improvement to 76 ABC: 43.1% Modified Fatigue Impact Scale: 83/84      GAIT: Assistive device utilized: None Level of assistance: CGA Comments: Patient tends to deviate to left during forward gait, shortened step length bilaterally, decreased hip extension, L hip circumduction      Posture: Pt rests in kyphotic posture, posterior pelvic tilt      LE MMT:   MMT (out of 5) Right 11/22/2022 Left 11/22/2022  Hip flexion 4- 4-  Hip extension      Hip abduction 4 4  Hip adduction 5 5  Hip internal rotation      Hip external rotation      Knee flexion 5 5  Knee extension 5 4+  Ankle dorsiflexion 4+ 4+  Ankle plantarflexion      Ankle inversion      Ankle eversion      (* = pain; Blank rows = not tested)     Sensation Impaired light touch medial malleolus/digits in L>RLE to light touch bilateral LEs. Proprioception, and hot/cold testing deferred on this date.     Reflexes Deferred     Cranial  Nerves Visual acuity and visual fields are intact  -convergence insufficiency  Extraocular muscles are intact  Facial sensation is intact bilaterally  Facial strength is intact bilaterally  Hearing is normal as tested by gross conversation Palate elevates midline, normal phonation  Shoulder shrug strength is intact  Tongue protrudes deviates to R side      Coordination/Cerebellar Finger to Nose: Intermittent hypometria  Heel to Shin: WNL Rapid alternating movements: WNL Finger Opposition: WNL Pronator Drift: Negative        FUNCTIONAL OUTCOME MEASURES     Results Comments  BERG 11/23/22: 51/56 Fall risk, in need of intervention  DGI 11/23/22: 16/24    TUG 11/23/22: 9.9 seconds    5TSTS 11/23/22: 21.8 seconds    6 Minute Walk Test 11/23/22: 735 ft    (Blank rows = not tested)         TODAY'S TREATMENT      Neuromuscular Re-education - for improved sensory integration, dynamic balance, equilibrium and non-equilibrium coordination as needed for negotiating home and community environment and stepping over obstacles   Forward hurdle step, (3) 6-inch hurdles and (2) 12-inch hurdles, consecutive; x5 D/B   Forward step-up to 6-inch step + Airex; 2x10 with bilateral LE  Forward stepping with visual targets on floor, blue agility ladder; x 5 D/B with verbal cueing for increased step length and heel strike  Unipedal stance; 2 x 10-15 sec, bilaterally; standing adjacent to treadmill for upper body support prn    Therapeutic Exercise - improved cardiovascular/neuromuscular endurance to improve community-level mobility, improved strength as needed to improve performance of CKC activities/functional movements and as needed for power production to prevent fall during episode of large postural perturbation    *Reviewed 0-10 VAS for fatigue and ensured fatigue remained at 2-3 or below throughout exercise performed. *Seated rest breaks were allowed in between sets and exercises to  minimize onset of neuromuscular fatigue    NuStep; Level 3, x 5 minutes, seat at 8  -subjective information gathered during this time   In parallel bars: high knees, 4x D/B   PATIENT EDUCATION: Reviewed concept of minimizing neuromuscular fatigue for MS specifically. Discussed positioning strategies and encouraged continued HEP from previous episodes of PT for shoulder and lower quarter pain.     *not today* Forward step-up; 6-inch step; 1x10 with each LE, no UE support on handrails  Sit to stand; 2x10, from standard chair with arms crossed over chest 3-way hip adjacent to treadmill armrest; 2x8 ea LE     PATIENT EDUCATION:  Education details: see above for patient education details Person educated: Patient Education method: Explanation Education comprehension: verbalized understanding     HOME EXERCISE PROGRAM: Access Code: [email or web address] URL: [email or web address] Date: 12/05/2022 Prepared by: Valentina Gu  Exercises - Sit to Stand Without Arm Support  - 2 x daily - 7 x weekly - 2 sets - 10 reps - Standing 3-Way Kick  - 2 x daily - 7 x weekly - 2 sets - 10 reps - Forward Step Up with Unilateral Counter Support  - 2 x daily - 7 x weekly - 2 sets - 10 reps      ASSESSMENT:   CLINICAL IMPRESSION:  Patient has various regions of pain with longstanding history of musculoskeletal issues (R shoulder pain during her work at [employer], sciatic-type symptoms and low back pain) with overlay of paresthesias and neuralgia related to multiple sclerosis. Discussed with patient following up with neurology on management of MS-related paresthesias and pain. Pt is challenged with exercises requiring unipedal stance and has intermittent buckling of RLE today. Pt does need further work on strengthening and high-level postural control  work to improve level of function. Patient has remaining deficits in LE weakness, impaired sensation and sensory integration, impaired postural  control, impaired gait stability with intermittent lateral staggering and deviation to L during forward gait. Patient will benefit from continued skilled therapeutic intervention to address the above deficits as needed for improved function and QoL.     REHAB POTENTIAL: Good   CLINICAL DECISION MAKING: Evolving/moderate complexity   EVALUATION COMPLEXITY: Moderate     GOALS: Goals reviewed with patient? No   SHORT TERM GOALS: Target date: 12/13/2022   Pt will be independent with HEP in order to improve strength and balance in order to decrease fall risk and improve function at home. Baseline: 11/21/22: Baseline HEP initiated Goal status: INITIAL       LONG TERM GOALS: Target date: 02/02/2023   Pt will increase FOTO to at least 44 to demonstrate significant improvement in function at home related to balance  Baseline: 11/21/22: 34 Goal status: INITIAL   2.  Pt will improve BERG by at least 3 points in order to demonstrate clinically significant improvement in balance.   Baseline: 11/21/22: To be obtained on visit # 2.       11/23/22: 51/56 Goal status: INITIAL   3.  Pt will improve ABC by at least 13% in order to demonstrate clinically significant improvement in balance confidence.      Baseline: 11/21/22: To be obtained on visit # 2.    11/23/22: 43.1% Goal status: INITIAL   4. Pt will decrease 5TSTS by at least 3 seconds in order to demonstrate clinically significant improvement in LE strength      Baseline: 11/21/22: 21.8 seconds.   Goal status: INITIAL   5. Pt will improve DGI by at least 3 points in order to demonstrate clinically significant improvement in balance and decreased risk for falls.     Baseline: 11/21/22: To be performed on visit # 2.   11/23/22: 16/24 Goal status: INITIAL   6. Pt will increase 6MWT by at least 45m(1627f in order to demonstrate clinically significant improvement in cardiopulmonary endurance and community-level ambulation   Baseline: 11/21/22: To be  performed on visit # 2.   11/23/22: 735 ft  Goal status: INITIAL     PLAN: PT FREQUENCY: 2x/week   PT DURATION: 8-10 weeks   PLANNED INTERVENTIONS: Therapeutic exercises, Therapeutic activity, Neuromuscular re-education, Balance training, Gait training, Patient/Family education, Electrical stimulation, Cryotherapy, and Manual therapy   PLAN FOR NEXT SESSION: Continue with LE strengthening with emphasis on anti-gravity mm and hip abductors. Progress with gait stability training and balance drills as able. *Precaution with evoking significant fatigue.     JeValentina GuPT, DPT #[id number] KempfPT 12/05/2022, 11:25 AM

## 2022-12-07 ENCOUNTER — Encounter: Payer: Medicaid Other | Admitting: Physical Therapy

## 2022-12-12 ENCOUNTER — Ambulatory Visit: Payer: Medicaid Other | Admitting: Physical Therapy

## 2022-12-14 ENCOUNTER — Encounter: Payer: Medicaid Other | Admitting: Physical Therapy

## 2022-12-21 ENCOUNTER — Encounter: Payer: Medicaid Other | Admitting: Physical Therapy

## 2022-12-26 ENCOUNTER — Encounter: Payer: Self-pay | Admitting: Physical Therapy

## 2022-12-26 ENCOUNTER — Ambulatory Visit: Payer: Medicaid Other | Attending: Psychiatry | Admitting: Physical Therapy

## 2022-12-26 DIAGNOSIS — R2689 Other abnormalities of gait and mobility: Secondary | ICD-10-CM | POA: Diagnosis present

## 2022-12-26 NOTE — Therapy (Signed)
OUTPATIENT PHYSICAL THERAPY TREATMENT NOTE   Patient Name: Amy Hubbard MRN: [id number] DOB:1971/05/31, 52 y.o., female Today's Date: 12/26/2022  PCP: Barbaraann Boys, MD  REFERRING PROVIDER: Collene Gobble, MD   END OF SESSION:   PT End of Session - 12/26/22 0838     Visit Number 5    Number of Visits 20    Date for PT Re-Evaluation 01/30/23    Authorization Type eval: 11/21/22    Progress Note Due on Visit 10    PT Start Time 0840    PT Stop Time 0925    PT Time Calculation (min) 45 min    Equipment Utilized During Treatment Gait belt    Activity Tolerance Patient limited by fatigue;Patient tolerated treatment well    Behavior During Therapy WFL for tasks assessed/performed                Past Medical History:  Diagnosis Date   Anxiety    Complication of anesthesia    PONV (postoperative nausea and vomiting)    Uterine fibroid    Past Surgical History:  Procedure Laterality Date   ABDOMINAL HYSTERECTOMY     BREAST BIOPSY Right 03/17/2017   fibroadenoma, bx/clip   CESAREAN SECTION N/A 10/29/2005   IUD REMOVAL     LAPAROSCOPIC VAGINAL HYSTERECTOMY WITH SALPINGECTOMY Bilateral 05/31/2019   Procedure: LAPAROSCOPIC ASSISTED VAGINAL HYSTERECTOMY WITH SALPINGECTOMY;  Surgeon: Boykin Nearing, MD;  Location: [facility];  Service: Gynecology;  Laterality: Bilateral;   Patient Active Problem List   Diagnosis Date Noted   Postoperative state 05/31/2019    REFERRING DIAG: G35 (ICD-10-CM) - Multiple sclerosis   THERAPY DIAG:  Imbalance  Rationale for Evaluation and Treatment Rehabilitation  PERTINENT HISTORY: Patient is a 52 year old female referred for impaired gait and balance with history of MS. Pt has f/u with neurology tomorrow. She feels like her symptoms have been more significant since late this past year, most notably with flare-up around Thanksgiving. Pt reports some neck pain, nausea. Pt was seeing Myles Gip, DPT before for pelvic health needs  and also underwent previous vestibular evaluation with Roxana Hires, DPT, [facility] prior to her MS diagnosis. Pt reports she has dealt with notable fatigue and has been sleeping more. Pt is undergoing OCREVUS infusions to treat MS. Pt has migraine disorder and undergoes Aimovig injection for this. Pt reports dizziness with rapid change in head position. Pt reports visual changes recently (some diplopia and difficulty focusing), memory loss. Pt reports intermittent stumbling into her wall when going down hallway.      Pain: Yes, headache and burning diffusely "all over," varies in location  Numbness/Tingling: Yes, numbness/tingling in her hands subtly  Focal Weakness: Yes, sometimes she has intermittent buckling LLE, intermittent loss of grip in her right hand; weakness associated with back pain  Recent changes in overall health/medication: Yes Prior history of physical therapy for balance:  No  Falls: Has patient fallen in last 6 months? No, Number of falls: N/A   Imaging: Yes    MR Brain 05/06/22 Chronic white matter changes stable since 2021. The appearance is nonspecific and could be seen with chronic demyelinating disease or chronic ischemia. Correlate with neurologic symptoms of multiple sclerosis.   Prior level of function: Independent with basic ADLs Occupational demands:Pt out of work now, applying for disability  Hobbies: Able to walk outside, walking to her mailbox   Red flags (bowel/bladder changes, saddle paresthesia, personal history of cancer, h/o spinal tumors, h/o compression fx, h/o abdominal aneurysm,  abdominal pain, chills/fever, night sweats, nausea, vomiting, unrelenting pain): Positive for night pain, history of positive breat biopsy      Weight Bearing Restrictions: No   Living Environment Lives with: lives with one of her daughters, pt is close with her neighbors Lives in: House/apartment One step to get in front door; bilateral railings to get up back steps (6-7  steps in back). [place] on her yard Has following equipment at home: Single point cane     Patient Goals: How to manage imbalance issues    PRECAUTIONS: Fall risk     SUBJECTIVE:                                                                                                                                                                                      SUBJECTIVE STATEMENT:  Patient has had difficulty with low back pain management. She followed up with Dr. Alba Destine on 12/19/22. Patient is planning to undergo bilateral L3, L4 medial branch and dorsal rami RFA (per referring provider). Pt reports sometimes having imbalance when leaning/reaching. She reports compliance with her HEP over the previous week once her back was not hurting her as much anymore.    PAIN:  Are you having pain? Yes Pain location: R iliolumbar/R lumbar flank    OBJECTIVE: (objective measures completed at initial evaluation unless otherwise dated)  Surveys FOTO: 78, predicted improvement to 86 ABC: 43.1% Modified Fatigue Impact Scale: 83/84      GAIT: Assistive device utilized: None Level of assistance: CGA Comments: Patient tends to deviate to left during forward gait, shortened step length bilaterally, decreased hip extension, L hip circumduction      Posture: Pt rests in kyphotic posture, posterior pelvic tilt      LE MMT:   MMT (out of 5) Right 11/22/2022 Left 11/22/2022  Hip flexion 4- 4-  Hip extension      Hip abduction 4 4  Hip adduction 5 5  Hip internal rotation      Hip external rotation      Knee flexion 5 5  Knee extension 5 4+  Ankle dorsiflexion 4+ 4+  Ankle plantarflexion      Ankle inversion      Ankle eversion      (* = pain; Blank rows = not tested)     Sensation Impaired light touch medial malleolus/digits in L>RLE to light touch bilateral LEs. Proprioception, and hot/cold testing deferred on this date.     Reflexes Deferred     Cranial Nerves Visual acuity and  visual fields are intact  -convergence insufficiency  Extraocular muscles are intact  Facial sensation is intact bilaterally  Facial strength  is intact bilaterally  Hearing is normal as tested by gross conversation Palate elevates midline, normal phonation  Shoulder shrug strength is intact  Tongue protrudes deviates to R side      Coordination/Cerebellar Finger to Nose: Intermittent hypometria  Heel to Shin: WNL Rapid alternating movements: WNL Finger Opposition: WNL Pronator Drift: Negative        FUNCTIONAL OUTCOME MEASURES     Results Comments  BERG 11/23/22: 51/56 Fall risk, in need of intervention  DGI 11/23/22: 16/24    TUG 11/23/22: 9.9 seconds    5TSTS 11/23/22: 21.8 seconds    6 Minute Walk Test 11/23/22: 735 ft    (Blank rows = not tested)         TODAY'S TREATMENT      Neuromuscular Re-education - for improved sensory integration, dynamic balance, equilibrium and non-equilibrium coordination as needed for negotiating home and community environment and stepping over obstacles  In // bars:  High knees, x4 D/B Forward hurdle step, (3) 6-inch hurdles and (2) 12-inch hurdles, consecutive; x5 D/B Forward step-up to 6-inch step + Airex; 2x10 with bilateral LE Unipedal stance; 2 x 10-15 sec, bilaterally; standing adjacent to treadmill for upper body support prn Forward lunge; 1x10 with each LE  -reviewed for HEP   *not today* Forward stepping with visual targets on floor, blue agility ladder; x 5 D/B with verbal cueing for increased step length and heel strike   Therapeutic Exercise - improved cardiovascular/neuromuscular endurance to improve community-level mobility, improved strength as needed to improve performance of CKC activities/functional movements and as needed for power production to prevent fall during episode of large postural perturbation    *Reviewed 0-10 VAS for fatigue and ensured fatigue remained at 2-3 or below throughout exercise  performed. *Seated rest breaks were allowed in between sets and exercises to minimize onset of neuromuscular fatigue   NuStep; Level 3, x 7 minutes, seat at 8  -subjective information gathered during this time     PATIENT EDUCATION: HEP update and review.     *not today* Forward step-up; 6-inch step; 1x10 with each LE, no UE support on handrails  Sit to stand; 2x10, from standard chair with arms crossed over chest 3-way hip adjacent to treadmill armrest; 2x8 ea LE     PATIENT EDUCATION:  Education details: see above for patient education details Person educated: Patient Education method: Explanation Education comprehension: verbalized understanding     HOME EXERCISE PROGRAM: Access Code: [email or web address] URL: [email or web address] Date: 12/26/2022 Prepared by: Valentina Gu  Exercises - Sit to Stand Without Arm Support  - 2 x daily - 7 x weekly - 2 sets - 10 reps - Standing 3-Way Kick  - 2 x daily - 7 x weekly - 2 sets - 10 reps - Forward Step Up with Unilateral Counter Support  - 2 x daily - 7 x weekly - 2 sets - 10 reps - Standing Single Leg Stance with Counter Support  - 1 x daily - 7 x weekly - 3 sets - 10-20 sec hold - Side Stepping with Resistance at Thighs and Counter Support  - 1 x daily - 4 x weekly - 1 sets - 4-6 reps - Forward Lunge with Counter Support  - 1 x daily - 4 x weekly - 2 sets - 10 reps      ASSESSMENT:   CLINICAL IMPRESSION:  Patient has been out of PT due to difficulty with low back and RLE pain management. Pt followed up with Girtha Hake, MD  for this and is planning on having nerve ablation pending insurance approval. In regard to current PT, pt does exhibit safe negotiation of obstacles of varying height and has minimal increase in fatigue scale beyond 4/10. Pt demonstrates normal Sharpened Rhomberg - ongoing deficit with R > LLE single-limb stance. HEP was updated today due to decreased PT frequency (pt decreased frequency of PT  following communication with front office due to pain management challenges and her current schedule). Patient has remaining deficits in LE weakness, impaired sensation and sensory integration, impaired postural control, impaired gait stability with intermittent lateral staggering and deviation to L during forward gait. Patient will benefit from continued skilled therapeutic intervention to address the above deficits as needed for improved function and QoL.     REHAB POTENTIAL: Good   CLINICAL DECISION MAKING: Evolving/moderate complexity   EVALUATION COMPLEXITY: Moderate     GOALS: Goals reviewed with patient? No   SHORT TERM GOALS: Target date: 12/13/2022   Pt will be independent with HEP in order to improve strength and balance in order to decrease fall risk and improve function at home. Baseline: 11/21/22: Baseline HEP initiated Goal status: INITIAL       LONG TERM GOALS: Target date: 02/02/2023   Pt will increase FOTO to at least 44 to demonstrate significant improvement in function at home related to balance  Baseline: 11/21/22: 34 Goal status: INITIAL   2.  Pt will improve BERG by at least 3 points in order to demonstrate clinically significant improvement in balance.   Baseline: 11/21/22: To be obtained on visit # 2.       11/23/22: 51/56 Goal status: INITIAL   3.  Pt will improve ABC by at least 13% in order to demonstrate clinically significant improvement in balance confidence.      Baseline: 11/21/22: To be obtained on visit # 2.    11/23/22: 43.1% Goal status: INITIAL   4. Pt will decrease 5TSTS by at least 3 seconds in order to demonstrate clinically significant improvement in LE strength      Baseline: 11/21/22: 21.8 seconds.   Goal status: INITIAL   5. Pt will improve DGI by at least 3 points in order to demonstrate clinically significant improvement in balance and decreased risk for falls.     Baseline: 11/21/22: To be performed on visit # 2.   11/23/22: 16/24 Goal status:  INITIAL   6. Pt will increase 6MWT by at least 58m(1617f in order to demonstrate clinically significant improvement in cardiopulmonary endurance and community-level ambulation   Baseline: 11/21/22: To be performed on visit # 2.   11/23/22: 735 ft  Goal status: INITIAL     PLAN: PT FREQUENCY: 2x/week   PT DURATION: 8-10 weeks   PLANNED INTERVENTIONS: Therapeutic exercises, Therapeutic activity, Neuromuscular re-education, Balance training, Gait training, Patient/Family education, Electrical stimulation, Cryotherapy, and Manual therapy   PLAN FOR NEXT SESSION: Continue with LE strengthening with emphasis on anti-gravity mm and hip abductors. Progress with gait stability training and balance drills as able. *Precaution with evoking significant fatigue.     JeValentina GuPT, DPT #[id number] KempfPT 12/26/2022, 8:38 AM

## 2022-12-28 ENCOUNTER — Encounter: Payer: Medicaid Other | Admitting: Physical Therapy

## 2023-01-02 ENCOUNTER — Ambulatory Visit: Payer: Medicaid Other | Admitting: Physical Therapy

## 2023-01-02 ENCOUNTER — Encounter: Payer: Self-pay | Admitting: Physical Therapy

## 2023-01-02 DIAGNOSIS — R2689 Other abnormalities of gait and mobility: Secondary | ICD-10-CM | POA: Diagnosis not present

## 2023-01-02 NOTE — Therapy (Signed)
OUTPATIENT PHYSICAL THERAPY TREATMENT NOTE   Patient Name: Amy Hubbard MRN: [id number] DOB:1971-08-06, 52 y.o., female Today's Date: 01/02/2023  PCP: Barbaraann Boys, MD  REFERRING PROVIDER: Collene Gobble, MD   END OF SESSION:   PT End of Session - 01/02/23 0839     Visit Number 6    Number of Visits 20    Date for PT Re-Evaluation 01/30/23    Authorization Type eval: 11/21/22    Progress Note Due on Visit 10    PT Start Time 0845    PT Stop Time 0927    PT Time Calculation (min) 42 min    Equipment Utilized During Treatment Gait belt    Activity Tolerance Patient limited by fatigue;Patient tolerated treatment well    Behavior During Therapy WFL for tasks assessed/performed             Past Medical History:  Diagnosis Date   Anxiety    Complication of anesthesia    PONV (postoperative nausea and vomiting)    Uterine fibroid    Past Surgical History:  Procedure Laterality Date   ABDOMINAL HYSTERECTOMY     BREAST BIOPSY Right 03/17/2017   fibroadenoma, bx/clip   CESAREAN SECTION N/A 10/29/2005   IUD REMOVAL     LAPAROSCOPIC VAGINAL HYSTERECTOMY WITH SALPINGECTOMY Bilateral 05/31/2019   Procedure: LAPAROSCOPIC ASSISTED VAGINAL HYSTERECTOMY WITH SALPINGECTOMY;  Surgeon: Boykin Nearing, MD;  Location: [facility];  Service: Gynecology;  Laterality: Bilateral;   Patient Active Problem List   Diagnosis Date Noted   Postoperative state 05/31/2019    REFERRING DIAG: G35 (ICD-10-CM) - Multiple sclerosis   THERAPY DIAG:  Imbalance  Rationale for Evaluation and Treatment Rehabilitation  PERTINENT HISTORY: Patient is a 52 year old female referred for impaired gait and balance with history of MS. Pt has f/u with neurology tomorrow. She feels like her symptoms have been more significant since late this past year, most notably with flare-up around Thanksgiving. Pt reports some neck pain, nausea. Pt was seeing Myles Gip, DPT before for pelvic health needs and  also underwent previous vestibular evaluation with Roxana Hires, DPT, [facility] prior to her MS diagnosis. Pt reports she has dealt with notable fatigue and has been sleeping more. Pt is undergoing OCREVUS infusions to treat MS. Pt has migraine disorder and undergoes Aimovig injection for this. Pt reports dizziness with rapid change in head position. Pt reports visual changes recently (some diplopia and difficulty focusing), memory loss. Pt reports intermittent stumbling into her wall when going down hallway.      Pain: Yes, headache and burning diffusely "all over," varies in location  Numbness/Tingling: Yes, numbness/tingling in her hands subtly  Focal Weakness: Yes, sometimes she has intermittent buckling LLE, intermittent loss of grip in her right hand; weakness associated with back pain  Recent changes in overall health/medication: Yes Prior history of physical therapy for balance:  No  Falls: Has patient fallen in last 6 months? No, Number of falls: N/A   Imaging: Yes    MR Brain 05/06/22 Chronic white matter changes stable since 2021. The appearance is nonspecific and could be seen with chronic demyelinating disease or chronic ischemia. Correlate with neurologic symptoms of multiple sclerosis.   Prior level of function: Independent with basic ADLs Occupational demands:Pt out of work now, applying for disability  Hobbies: Able to walk outside, walking to her mailbox   Red flags (bowel/bladder changes, saddle paresthesia, personal history of cancer, h/o spinal tumors, h/o compression fx, h/o abdominal aneurysm, abdominal pain, chills/fever,  night sweats, nausea, vomiting, unrelenting pain): Positive for night pain, history of positive breat biopsy      Weight Bearing Restrictions: No   Living Environment Lives with: lives with one of her daughters, pt is close with her neighbors Lives in: House/apartment One step to get in front door; bilateral railings to get up back steps (6-7 steps in  back). [place] on her yard Has following equipment at home: Single point cane     Patient Goals: How to manage imbalance issues    PRECAUTIONS: Fall risk     SUBJECTIVE:                                                                                                                                                                                      SUBJECTIVE STATEMENT:  Patient reports mild low back symptoms at this time that aren't very bothersome. She reports throughout the week last week, she was asleep throughout the days and was often up during the night. She reports 3/10 fatigue at arrival to PT. She has experienced intermittent peri-orbital pain and headache that does not seem like migraine to her. Pt feels that her balance has been good the last few days. Pt follows up with neurology tomorrow.    PAIN:  Are you having pain? Mild low back symptoms at arrival Pain location: R iliolumbar/R lumbar flank    OBJECTIVE: (objective measures completed at initial evaluation unless otherwise dated)  Surveys FOTO: 46, predicted improvement to 26 ABC: 43.1% Modified Fatigue Impact Scale: 83/84      GAIT: Assistive device utilized: None Level of assistance: CGA Comments: Patient tends to deviate to left during forward gait, shortened step length bilaterally, decreased hip extension, L hip circumduction      Posture: Pt rests in kyphotic posture, posterior pelvic tilt      LE MMT:   MMT (out of 5) Right 11/22/2022 Left 11/22/2022  Hip flexion 4- 4-  Hip extension      Hip abduction 4 4  Hip adduction 5 5  Hip internal rotation      Hip external rotation      Knee flexion 5 5  Knee extension 5 4+  Ankle dorsiflexion 4+ 4+  Ankle plantarflexion      Ankle inversion      Ankle eversion      (* = pain; Blank rows = not tested)     Sensation Impaired light touch medial malleolus/digits in L>RLE to light touch bilateral LEs. Proprioception, and hot/cold testing deferred on  this date.     Reflexes Deferred     Cranial Nerves Visual acuity and visual fields are intact  -  convergence insufficiency  Extraocular muscles are intact  Facial sensation is intact bilaterally  Facial strength is intact bilaterally  Hearing is normal as tested by gross conversation Palate elevates midline, normal phonation  Shoulder shrug strength is intact  Tongue protrudes deviates to R side      Coordination/Cerebellar Finger to Nose: Intermittent hypometria  Heel to Shin: WNL Rapid alternating movements: WNL Finger Opposition: WNL Pronator Drift: Negative        FUNCTIONAL OUTCOME MEASURES     Results Comments  BERG 11/23/22: 51/56 Fall risk, in need of intervention  DGI 11/23/22: 16/24    TUG 11/23/22: 9.9 seconds    5TSTS 11/23/22: 21.8 seconds    6 Minute Walk Test 11/23/22: 735 ft    (Blank rows = not tested)         TODAY'S TREATMENT      Neuromuscular Re-education - for improved sensory integration, dynamic balance, equilibrium and non-equilibrium coordination as needed for negotiating home and community environment and stepping over obstacles  In // bars:  High knees, x4 D/B Forward hurdle step, (3) 6-inch hurdles and (2) 12-inch hurdles, consecutive; x5 D/B  In hallway: Obstacle course in hallway: 5 cones R and L alternating cone tap, long Airex Tandem walk, 6-inch step up and down; x3 D/B   Forward step-up with hurdle stance + holding unipedal stance x 3 sec at top; 1x10 on 6-inch step (using staircase in center of gym)   -for improved stability with stair/curb/obstacle negotiation and dynamic unipedal stance   *next visit* Unipedal stance; 2 x 10-15 sec, bilaterally; standing adjacent to treadmill for upper body support prn   *not today* Forward lunge; 1x10 with each LE Forward step-up to 6-inch step + Airex; 2x10 with bilateral LE Forward stepping with visual targets on floor, blue agility ladder; x 5 D/B with verbal cueing for increased  step length and heel strike    Therapeutic Exercise - improved cardiovascular/neuromuscular endurance to improve community-level mobility, improved strength as needed to improve performance of CKC activities/functional movements and as needed for power production to prevent fall during episode of large postural perturbation    *Reviewed 0-10 VAS for fatigue and ensured fatigue remained at 2-3 or below throughout exercise performed. *Seated rest breaks were allowed in between sets and exercises to minimize onset of neuromuscular fatigue   NuStep; Level 3, x 5 minutes, seat at 8  -subjective information gathered during this time    PATIENT EDUCATION: discussed at length continuing to monitor fatigue and using fatigue scale as reference to prevent exacerbation of MS-related symptoms     *not today* Forward step-up; 6-inch step; 1x10 with each LE, no UE support on handrails  Sit to stand; 2x10, from standard chair with arms crossed over chest 3-way hip adjacent to treadmill armrest; 2x8 ea LE     PATIENT EDUCATION:  Education details: see above for patient education details Person educated: Patient Education method: Explanation Education comprehension: verbalized understanding     HOME EXERCISE PROGRAM: Access Code: [email or web address] URL: [email or web address] Date: 12/26/2022 Prepared by: Valentina Gu  Exercises - Sit to Stand Without Arm Support  - 2 x daily - 7 x weekly - 2 sets - 10 reps - Standing 3-Way Kick  - 2 x daily - 7 x weekly - 2 sets - 10 reps - Forward Step Up with Unilateral Counter Support  - 2 x daily - 7 x weekly - 2 sets - 10 reps - Standing Single Leg Stance with Counter Support  -  1 x daily - 7 x weekly - 3 sets - 10-20 sec hold - Side Stepping with Resistance at Thighs and Counter Support  - 1 x daily - 4 x weekly - 1 sets - 4-6 reps - Forward Lunge with Counter Support  - 1 x daily - 4 x weekly - 2 sets - 10 reps      ASSESSMENT:    CLINICAL IMPRESSION:  Patient has experienced significant fatigue over the previous week and has slept throughout the day and has been awake more at night time. Patient feels that her balance has been better recently, though fatigue and drowsiness are still notably affecting her level of function. Pt is following up with neurology and can discuss MS-related fatigue versus medication side effects/dosing. Pt demonstrates good faith effort with PT, but she does have difficulty with full participation due to the aforementioned symptoms and comorbid musculoskeletal pain/LBP for which she is following up with Girtha Hake and will soon undergo nerve ablation. Patient has remaining deficits in LE weakness, impaired sensation and sensory integration, impaired postural control, impaired gait stability with intermittent lateral staggering and deviation to L during forward gait. Patient will benefit from continued skilled therapeutic intervention to address the above deficits as needed for improved function and QoL.     REHAB POTENTIAL: Good   CLINICAL DECISION MAKING: Evolving/moderate complexity   EVALUATION COMPLEXITY: Moderate     GOALS: Goals reviewed with patient? No   SHORT TERM GOALS: Target date: 12/13/2022   Pt will be independent with HEP in order to improve strength and balance in order to decrease fall risk and improve function at home. Baseline: 11/21/22: Baseline HEP initiated Goal status: INITIAL       LONG TERM GOALS: Target date: 02/02/2023   Pt will increase FOTO to at least 44 to demonstrate significant improvement in function at home related to balance  Baseline: 11/21/22: 34 Goal status: INITIAL   2.  Pt will improve BERG by at least 3 points in order to demonstrate clinically significant improvement in balance.   Baseline: 11/21/22: To be obtained on visit # 2.       11/23/22: 51/56 Goal status: INITIAL   3.  Pt will improve ABC by at least 13% in order to demonstrate  clinically significant improvement in balance confidence.      Baseline: 11/21/22: To be obtained on visit # 2.    11/23/22: 43.1% Goal status: INITIAL   4. Pt will decrease 5TSTS by at least 3 seconds in order to demonstrate clinically significant improvement in LE strength      Baseline: 11/21/22: 21.8 seconds.   Goal status: INITIAL   5. Pt will improve DGI by at least 3 points in order to demonstrate clinically significant improvement in balance and decreased risk for falls.     Baseline: 11/21/22: To be performed on visit # 2.   11/23/22: 16/24 Goal status: INITIAL   6. Pt will increase 6MWT by at least 41m(1687f in order to demonstrate clinically significant improvement in cardiopulmonary endurance and community-level ambulation   Baseline: 11/21/22: To be performed on visit # 2.   11/23/22: 735 ft  Goal status: INITIAL     PLAN: PT FREQUENCY: 2x/week   PT DURATION: 8-10 weeks   PLANNED INTERVENTIONS: Therapeutic exercises, Therapeutic activity, Neuromuscular re-education, Balance training, Gait training, Patient/Family education, Electrical stimulation, Cryotherapy, and Manual therapy   PLAN FOR NEXT SESSION: Continue with LE strengthening with emphasis on anti-gravity mm and hip abductors.  Progress with gait stability training and balance drills as able. *Precaution with evoking significant fatigue.      Valentina Gu, PT, DPT [id number]  Eilleen Kempf, PT 01/02/2023, 8:39 AM

## 2023-01-03 ENCOUNTER — Other Ambulatory Visit: Payer: Self-pay | Admitting: Pediatrics

## 2023-01-03 DIAGNOSIS — Z1231 Encounter for screening mammogram for malignant neoplasm of breast: Secondary | ICD-10-CM

## 2023-01-09 ENCOUNTER — Ambulatory Visit: Payer: Medicaid Other | Admitting: Physical Therapy

## 2023-01-09 ENCOUNTER — Encounter: Payer: Self-pay | Admitting: Physical Therapy

## 2023-01-09 DIAGNOSIS — R2689 Other abnormalities of gait and mobility: Secondary | ICD-10-CM | POA: Diagnosis not present

## 2023-01-09 NOTE — Therapy (Signed)
OUTPATIENT PHYSICAL THERAPY TREATMENT NOTE   Patient Name: Amy Hubbard MRN: [id number] DOB:02-Jan-1971, 52 y.o., female Today's Date: 01/09/2023  PCP: Barbaraann Boys, MD  REFERRING PROVIDER: Collene Gobble, MD   END OF SESSION:   PT End of Session - 01/09/23 0853     Visit Number 7    Number of Visits 20    Date for PT Re-Evaluation 01/30/23    Authorization Type eval: 11/21/22    Progress Note Due on Visit 10    PT Start Time 0843    PT Stop Time 0925    PT Time Calculation (min) 42 min    Equipment Utilized During Treatment Gait belt    Activity Tolerance Patient limited by fatigue;Patient tolerated treatment well    Behavior During Therapy WFL for tasks assessed/performed              Past Medical History:  Diagnosis Date   Anxiety    Complication of anesthesia    PONV (postoperative nausea and vomiting)    Uterine fibroid    Past Surgical History:  Procedure Laterality Date   ABDOMINAL HYSTERECTOMY     BREAST BIOPSY Right 03/17/2017   fibroadenoma, bx/clip   CESAREAN SECTION N/A 10/29/2005   IUD REMOVAL     LAPAROSCOPIC VAGINAL HYSTERECTOMY WITH SALPINGECTOMY Bilateral 05/31/2019   Procedure: LAPAROSCOPIC ASSISTED VAGINAL HYSTERECTOMY WITH SALPINGECTOMY;  Surgeon: Boykin Nearing, MD;  Location: [facility];  Service: Gynecology;  Laterality: Bilateral;   Patient Active Problem List   Diagnosis Date Noted   Postoperative state 05/31/2019    REFERRING DIAG: G35 (ICD-10-CM) - Multiple sclerosis   THERAPY DIAG:  Imbalance  Rationale for Evaluation and Treatment Rehabilitation  PERTINENT HISTORY: Patient is a 52 year old female referred for impaired gait and balance with history of MS. Pt has f/u with neurology tomorrow. She feels like her symptoms have been more significant since late this past year, most notably with flare-up around Thanksgiving. Pt reports some neck pain, nausea. Pt was seeing Myles Gip, DPT before for pelvic health needs and  also underwent previous vestibular evaluation with Roxana Hires, DPT, [facility] prior to her MS diagnosis. Pt reports she has dealt with notable fatigue and has been sleeping more. Pt is undergoing OCREVUS infusions to treat MS. Pt has migraine disorder and undergoes Aimovig injection for this. Pt reports dizziness with rapid change in head position. Pt reports visual changes recently (some diplopia and difficulty focusing), memory loss. Pt reports intermittent stumbling into her wall when going down hallway.      Pain: Yes, headache and burning diffusely "all over," varies in location  Numbness/Tingling: Yes, numbness/tingling in her hands subtly  Focal Weakness: Yes, sometimes she has intermittent buckling LLE, intermittent loss of grip in her right hand; weakness associated with back pain  Recent changes in overall health/medication: Yes Prior history of physical therapy for balance:  No  Falls: Has patient fallen in last 6 months? No, Number of falls: N/A   Imaging: Yes    MR Brain 05/06/22 Chronic white matter changes stable since 2021. The appearance is nonspecific and could be seen with chronic demyelinating disease or chronic ischemia. Correlate with neurologic symptoms of multiple sclerosis.   Prior level of function: Independent with basic ADLs Occupational demands:Pt out of work now, applying for disability  Hobbies: Able to walk outside, walking to her mailbox   Red flags (bowel/bladder changes, saddle paresthesia, personal history of cancer, h/o spinal tumors, h/o compression fx, h/o abdominal aneurysm, abdominal pain,  chills/fever, night sweats, nausea, vomiting, unrelenting pain): Positive for night pain, history of positive breat biopsy      Weight Bearing Restrictions: No   Living Environment Lives with: lives with one of her daughters, pt is close with her neighbors Lives in: House/apartment One step to get in front door; bilateral railings to get up back steps (6-7 steps in  back). [place] on her yard Has following equipment at home: Single point cane     Patient Goals: How to manage imbalance issues    PRECAUTIONS: Fall risk     SUBJECTIVE:                                                                                                                                                                                      SUBJECTIVE STATEMENT:  Patient reports ongoing significant episodes of low back pain over this past weekend with pain in low back c spasms. She reports fleeting episodes of increased unsteadiness. She reports feeling that her balance has gotten better and she feels that balance PT overall has helped. Pt has new referral for FCE and for cervicalgia and low back pain.    PAIN:  Are you having pain? Yes, 6/10 pain R low back Pain location: R iliolumbar/R lumbar flank    OBJECTIVE: (objective measures completed at initial evaluation unless otherwise dated)  Surveys FOTO: 33, predicted improvement to 67 ABC: 43.1% Modified Fatigue Impact Scale: 83/84      GAIT: Assistive device utilized: None Level of assistance: CGA Comments: Patient tends to deviate to left during forward gait, shortened step length bilaterally, decreased hip extension, L hip circumduction      Posture: Pt rests in kyphotic posture, posterior pelvic tilt      LE MMT:   MMT (out of 5) Right 11/22/2022 Left 11/22/2022  Hip flexion 4- 4-  Hip extension      Hip abduction 4 4  Hip adduction 5 5  Hip internal rotation      Hip external rotation      Knee flexion 5 5  Knee extension 5 4+  Ankle dorsiflexion 4+ 4+  Ankle plantarflexion      Ankle inversion      Ankle eversion      (* = pain; Blank rows = not tested)     Sensation Impaired light touch medial malleolus/digits in L>RLE to light touch bilateral LEs. Proprioception, and hot/cold testing deferred on this date.     Reflexes Deferred     Cranial Nerves Visual acuity and visual fields are intact   -convergence insufficiency  Extraocular muscles are intact  Facial sensation is intact bilaterally  Facial strength  is intact bilaterally  Hearing is normal as tested by gross conversation Palate elevates midline, normal phonation  Shoulder shrug strength is intact  Tongue protrudes deviates to R side      Coordination/Cerebellar Finger to Nose: Intermittent hypometria  Heel to Shin: WNL Rapid alternating movements: WNL Finger Opposition: WNL Pronator Drift: Negative        FUNCTIONAL OUTCOME MEASURES INITIAL EVAL   Results Comments  BERG 11/23/22: 51/56 Fall risk, in need of intervention  DGI 11/23/22: 16/24    TUG 11/23/22: 9.9 seconds    5TSTS 11/23/22: 21.8 seconds    6 Minute Walk Test 11/23/22: 735 ft    (Blank rows = not tested)    BERG 01/09/23  [facility] PT Assessment - 01/09/23 0001       Berg Balance Test   Sit to Stand Able to stand without using hands and stabilize independently    Standing Unsupported Able to stand safely 2 minutes    Sitting with Back Unsupported but Feet Supported on Floor or Stool Able to sit safely and securely 2 minutes    Stand to Sit Sits safely with minimal use of hands    Transfers Able to transfer safely, minor use of hands    Standing Unsupported with Eyes Closed Able to stand 10 seconds safely    Standing Unsupported with Feet Together Able to place feet together independently and stand 1 minute safely    From Standing, Reach Forward with Outstretched Arm Can reach confidently >25 cm (10")    From Standing Position, Pick up Object from Floor Able to pick up shoe safely and easily    From Standing Position, Turn to Look Behind Over each Shoulder Looks behind from both sides and weight shifts well    Turn 360 Degrees Able to turn 360 degrees safely in 4 seconds or less    Standing Unsupported, Alternately Place Feet on Step/Stool Able to stand independently and safely and complete 8 steps in 20 seconds    Standing Unsupported, One Foot in  Front Able to place foot tandem independently and hold 30 seconds    Standing on One Leg Able to lift leg independently and hold > 10 seconds    Total Score 56                 TODAY'S TREATMENT      Neuromuscular Re-education - for improved sensory integration, dynamic balance, equilibrium and non-equilibrium coordination as needed for negotiating home and community environment and stepping over obstacles  Performance of BERG and DGI* (see updates in Goals section)   *not today* High knees, x4 D/B Forward hurdle step, (3) 6-inch hurdles and (2) 12-inch hurdles, consecutive; x5 D/B In hallway: Obstacle course in hallway: 5 cones R and L alternating cone tap, long Airex Tandem walk, 6-inch step up and down; x3 D/B  Forward step-up with hurdle stance + holding unipedal stance x 3 sec at top; 1x10 on 6-inch step (using staircase in center of gym)   -for improved stability with stair/curb/obstacle negotiation and dynamic unipedal stance Unipedal stance; 2 x 10-15 sec, bilaterally; standing adjacent to treadmill for upper body support prn Forward lunge; 1x10 with each LE Forward step-up to 6-inch step + Airex; 2x10 with bilateral LE Forward stepping with visual targets on floor, blue agility ladder; x 5 D/B with verbal cueing for increased step length and heel strike    Therapeutic Exercise - improved cardiovascular/neuromuscular endurance to improve community-level mobility, improved strength as needed to  improve performance of CKC activities/functional movements and as needed for power production to prevent fall during episode of large postural perturbation    *Reviewed 0-10 VAS for fatigue and ensured fatigue remained at 2-3 or below throughout exercise performed. *Seated rest breaks were allowed in between sets and exercises to minimize onset of neuromuscular fatigue   NuStep; Level 2, x 5 minutes, seat at 8  -cold pack along back during warm-up on bike; decreased resistance  due to pack pain with reciprocal motion  -subjective information gathered during this time    *GOAL UPDATE PERFORMED   PATIENT EDUCATION: discussed change in focus of PT to low back and lower quarter pain given repeated episodes of low back pain limiting patient's level of function and QoL. Discussed results of re-assessment today and goals met.     *not today* Forward step-up; 6-inch step; 1x10 with each LE, no UE support on handrails  Sit to stand; 2x10, from standard chair with arms crossed over chest 3-way hip adjacent to treadmill armrest; 2x8 ea LE     PATIENT EDUCATION:  Education details: see above for patient education details Person educated: Patient Education method: Explanation Education comprehension: verbalized understanding     HOME EXERCISE PROGRAM: Access Code: [email or web address] URL: [email or web address] Date: 12/26/2022 Prepared by: Valentina Gu  Exercises - Sit to Stand Without Arm Support  - 2 x daily - 7 x weekly - 2 sets - 10 reps - Standing 3-Way Kick  - 2 x daily - 7 x weekly - 2 sets - 10 reps - Forward Step Up with Unilateral Counter Support  - 2 x daily - 7 x weekly - 2 sets - 10 reps - Standing Single Leg Stance with Counter Support  - 1 x daily - 7 x weekly - 3 sets - 10-20 sec hold - Side Stepping with Resistance at Thighs and Counter Support  - 1 x daily - 4 x weekly - 1 sets - 4-6 reps - Forward Lunge with Counter Support  - 1 x daily - 4 x weekly - 2 sets - 10 reps      ASSESSMENT:   CLINICAL IMPRESSION:  Patient has met almost all established goals related to imbalance/fall risk. We were not able to get to 6-minute walk test today to check for aerobic endurance. We will complete this next visit. Patient does feel that steadiness/balance has remarkably improved and she reports significantly higher ABC scale. Patient has ongoing complaints of back pain and has limited her activity due to lumbar/flank pain; pt has new referral for  cervicalgia and low back pain. We discussed moving forward next visit with assessment for lumbar spine and completing more targeted therapy for low back pain given notable improvement in balance and low relative fall risk at this time. Patient's condition may change over time with progressive nature of MS, but pt is able to access home and community with relatively low fall risk at this time; we discussed following up with her physician and informing PT if she feels that her balance does worsen. Pt is appropriate for transition in focus of PT to low back given new referral and progress made to date.  Patient will benefit from continued skilled therapeutic intervention to address the above deficits as needed for improved function and QoL.     REHAB POTENTIAL: Good   CLINICAL DECISION MAKING: Evolving/moderate complexity   EVALUATION COMPLEXITY: Moderate     GOALS: Goals reviewed with patient? No   SHORT TERM GOALS: Target date:  12/13/2022   Pt will be independent with HEP in order to improve strength and balance in order to decrease fall risk and improve function at home. Baseline: 11/21/22: Baseline HEP initiated.    01/09/23: Pt is able to complete HEP on most days, held on days in which she had notable spasm or wasn't feeling well  Goal status: ACHIEVED       LONG TERM GOALS: Target date: 02/02/2023   Pt will increase FOTO to at least 44 to demonstrate significant improvement in function at home related to balance  Baseline: 11/21/22: 34.      01/09/23: 60/44 Goal status: ACHIEVED   2.  Pt will improve BERG by at least 3 points in order to demonstrate clinically significant improvement in balance.   Baseline: 11/21/22: To be obtained on visit # 2.       11/23/22: 51/56     01/09/23: 56/56 Goal status: ACHIEVED   3.  Pt will improve ABC by at least 13% in order to demonstrate clinically significant improvement in balance confidence.      Baseline: 11/21/22: To be obtained on visit # 2.    11/23/22:  43.1%     01/09/23: 94.4% Goal status: ACHIEVED   4. Pt will decrease 5TSTS by at least 3 seconds in order to demonstrate clinically significant improvement in LE strength      Baseline: 11/21/22: 21.8 seconds.     01/09/23: 17.31 Goal status: ACHIEVED    5. Pt will improve DGI by at least 3 points in order to demonstrate clinically significant improvement in balance and decreased risk for falls.     Baseline: 11/21/22: To be performed on visit # 2.   11/23/22: 16/24.     01/09/23: 21/24 Goal status: ACHIEVED   6. Pt will increase 6MWT by at least 56m(1634f in order to demonstrate clinically significant improvement in cardiopulmonary endurance and community-level ambulation   Baseline: 11/21/22: To be performed on visit # 2.   11/23/22: [id number]     01/09/23: Next visit  Goal status: INITIAL     PLAN: PT FREQUENCY: 2x/week   PT DURATION: 8-10 weeks   PLANNED INTERVENTIONS: Therapeutic exercises, Therapeutic activity, Neuromuscular re-education, Balance training, Gait training, Patient/Family education, Electrical stimulation, Cryotherapy, and Manual therapy   PLAN FOR NEXT SESSION: Complete 6-minute walk, complete re-assessment and update cert for low back pain; continue with manual therapy and exercise focused on low back with updated HEP for lower quarter impairments    JeValentina GuPT, DPT #[id number] KempfPT 01/09/2023, 9:16 AM

## 2023-01-16 ENCOUNTER — Encounter: Payer: Self-pay | Admitting: Physical Therapy

## 2023-01-16 ENCOUNTER — Ambulatory Visit: Payer: Medicaid Other | Attending: Psychiatry | Admitting: Physical Therapy

## 2023-01-16 DIAGNOSIS — M5459 Other low back pain: Secondary | ICD-10-CM | POA: Insufficient documentation

## 2023-01-16 DIAGNOSIS — M6281 Muscle weakness (generalized): Secondary | ICD-10-CM | POA: Diagnosis not present

## 2023-01-16 DIAGNOSIS — R293 Abnormal posture: Secondary | ICD-10-CM | POA: Diagnosis present

## 2023-01-16 NOTE — Therapy (Signed)
OUTPATIENT PHYSICAL THERAPY TREATMENT NOTE/RE-ASSESSMENT   Patient Name: SHONIQUA EVJEN MRN: [id number] DOB:Mar 02, 1971, 52 y.o., female Today's Date: 01/16/2023  PCP: Barbaraann Boys, MD  REFERRING PROVIDER: Collene Gobble, MD   END OF SESSION:   PT End of Session - 01/16/23 1036     Visit Number 1    Number of Visits 13    Date for PT Re-Evaluation 02/27/23    Authorization Type re-assess for lumbar spine 01/16/23    Progress Note Due on Visit 10    PT Start Time 0815    PT Stop Time 0901    PT Time Calculation (min) 46 min    Equipment Utilized During Treatment Gait belt    Activity Tolerance Patient tolerated treatment well    Behavior During Therapy WFL for tasks assessed/performed               Past Medical History:  Diagnosis Date   Anxiety    Complication of anesthesia    PONV (postoperative nausea and vomiting)    Uterine fibroid    Past Surgical History:  Procedure Laterality Date   ABDOMINAL HYSTERECTOMY     BREAST BIOPSY Right 03/17/2017   fibroadenoma, bx/clip   CESAREAN SECTION N/A 10/29/2005   IUD REMOVAL     LAPAROSCOPIC VAGINAL HYSTERECTOMY WITH SALPINGECTOMY Bilateral 05/31/2019   Procedure: LAPAROSCOPIC ASSISTED VAGINAL HYSTERECTOMY WITH SALPINGECTOMY;  Surgeon: Boykin Nearing, MD;  Location: [facility];  Service: Gynecology;  Laterality: Bilateral;   Patient Active Problem List   Diagnosis Date Noted   Postoperative state 05/31/2019    REFERRING DIAG: G35 (ICD-10-CM) - Multiple sclerosis   THERAPY DIAG:  Muscle weakness (generalized)  Other low back pain  Abnormal posture  Rationale for Evaluation and Treatment Rehabilitation  PERTINENT HISTORY: Patient is a 52 year old female referred for impaired gait and balance with history of MS. Pt has f/u with neurology tomorrow. She feels like her symptoms have been more significant since late this past year, most notably with flare-up around Thanksgiving. Pt reports some neck pain,  nausea. Pt was seeing Myles Gip, DPT before for pelvic health needs and also underwent previous vestibular evaluation with Roxana Hires, DPT, [facility] prior to her MS diagnosis. Pt reports she has dealt with notable fatigue and has been sleeping more. Pt is undergoing OCREVUS infusions to treat MS. Pt has migraine disorder and undergoes Aimovig injection for this. Pt reports dizziness with rapid change in head position. Pt reports visual changes recently (some diplopia and difficulty focusing), memory loss. Pt reports intermittent stumbling into her wall when going down hallway.      Pain: Yes, headache and burning diffusely "all over," varies in location  Numbness/Tingling: Yes, numbness/tingling in her hands subtly  Focal Weakness: Yes, sometimes she has intermittent buckling LLE, intermittent loss of grip in her right hand; weakness associated with back pain  Recent changes in overall health/medication: Yes Prior history of physical therapy for balance:  No  Falls: Has patient fallen in last 6 months? No, Number of falls: N/A   Imaging: Yes    MR Brain 05/06/22 Chronic white matter changes stable since 2021. The appearance is nonspecific and could be seen with chronic demyelinating disease or chronic ischemia. Correlate with neurologic symptoms of multiple sclerosis.   Prior level of function: Independent with basic ADLs Occupational demands:Pt out of work now, applying for disability  Hobbies: Able to walk outside, walking to her mailbox   Red flags (bowel/bladder changes, saddle paresthesia, personal history of cancer,  h/o spinal tumors, h/o compression fx, h/o abdominal aneurysm, abdominal pain, chills/fever, night sweats, nausea, vomiting, unrelenting pain): Positive for night pain, history of positive breat biopsy      Weight Bearing Restrictions: No   Living Environment Lives with: lives with one of her daughters, pt is close with her neighbors Lives in: House/apartment One step  to get in front door; bilateral railings to get up back steps (6-7 steps in back). [place] on her yard Has following equipment at home: Single point cane     Patient Goals: How to manage imbalance issues    PRECAUTIONS: Fall risk     SUBJECTIVE:                                                                                                                                                                                      SUBJECTIVE STATEMENT:  Patient reports her fatigue has been better.She had to stop Oxcarbazepine due to allergic reaction. She feels that her back has improved modestly. Pt reports using topical menthol treatment on her back Saturday due to notable pain. Pt has nerve ablation on 02/06/23 for low back pain. Pt feels her pain alternates from one side to the other. She reports pain in her legs historically, but not over the last week. She states leg that is more affected can alternate side to side. Patient reports disturbed sleep before with back pain - "not in a couple of weeks." No bowel/bladder changes. She feels her balance is improved substantially and she is ready to focus on other needs. Aggravated by: sitting, prolonged standing (e.g. preparing food). Relieved by: lying down on her side (usually left), Epsom salt bath. She states she did have numbness/tingling in her legs (sometimes in feet, sometimes in fingertips), but not as much now. She reports feeling "stiff/sore" presently.   PAIN:  Are you having pain? Yes, 6/10 pain in low back, R>L pan affecting lower lumbar/PSIS region Pain location: Across low back, up to thoracic spine, down to glutes    OBJECTIVE: (objective measures completed at initial evaluation unless otherwise dated)  Surveys FOTO: 70, predicted improvement to 87 ABC: 43.1% Modified Fatigue Impact Scale: 83/84      GAIT: Assistive device utilized: None Level of assistance: CGA Comments: Patient tends to deviate to left during forward gait,  shortened step length bilaterally, decreased hip extension, L hip circumduction      Posture: Pt rests in kyphotic posture, posterior pelvic tilt     AROM AROM (Normal range in degrees) AROM   Lumbar   Flexion (65) 25%* (pain in back)  Extension (30) 50%* (axial pain in back)  Right lateral flexion (25)  75%*  Left lateral flexion (25) 100%  Right rotation (30) 25%*  Left rotation (30) 50% "tight"      Hip Right Left  Flexion (125) 110 110  Extension (15)    Abduction (40) WNL WNL  Adduction     Internal Rotation (45) WNL WNL  External Rotation (45) WNL WNL       Knee    Flexion (135)    Extension (0)        Ankle    Dorsiflexion (20)    Plantarflexion (50)    Inversion (35)    Eversion (15)    (* = pain; Blank rows = not tested)    LE MMT:   MMT (out of 5) Right 11/22/2022 Left 11/22/2022 Right 01/16/23 Left 01/16/23  Hip flexion 4- 4- 4-* 4-  Hip extension        Hip abduction '4 4 5 '$ (seated) 5 (seated)   Hip adduction 5 5    Hip internal rotation        Hip external rotation        Knee flexion 5 5 4+ 5  Knee extension 5 4+ 5 5  Ankle dorsiflexion 4+ 4+ 5 5  Ankle plantarflexion        Ankle inversion        Ankle eversion        (* = pain; Blank rows = not tested)    Heel walking and toe walking WNL    Muscle Length Hamstrings: R: {NEGATIVE/POSITIVE [id number] L: {NEGATIVE/POSITIVE TW:354642 Ely (quadriceps): R: {NEGATIVE/POSITIVE TW:354642 L: {NEGATIVE/POSITIVE TW:354642 Thomas (hip flexors): R: {NEGATIVE/POSITIVE TW:354642 L: {NEGATIVE/POSITIVE [id number] Ober: R: {NEGATIVE/POSITIVE TW:354642 L: {NEGATIVE/POSITIVE TW:354642     Palpation Location Right Left         Lumbar paraspinals    Quadratus Lumborum    Gluteus Maximus    Gluteus Medius    Deep hip external rotators    PSIS    Fortin's Area (SIJ)    Greater Trochanter    (Blank rows = not tested) Graded on 0-4 scale (0 = no pain, 1 = pain, 2 = pain with  wincing/grimacing/flinching, 3 = pain with withdrawal, 4 = unwilling to allow palpation)  Passive Accessory Intervertebral Motion Pt denies reproduction of back pain with CPA L1-L5 and UPA bilaterally L1-L5. Generally, hypomobile throughout  Special Tests Lumbar Radiculopathy and Discogenic: Centralization and Peripheralization (SN 92, -LR 0.12): {NEGATIVE/POSITIVE FOR:19998} Slump (SN 83, -LR 0.32): R: Positive L: Positive SLR (SN 92, -LR 0.29): R: Positive L:  Negative   Facet Joint: Extension-Rotation (SN 100, -LR 0.0): R: Positive L: Negative  Lumbar Foraminal Stenosis: Lumbar quadrant (SN 70): R: Negative L: Negative  Hip: FABER (SN 81): R: Positive L: Negative FADIR (SN 94): R: Not done L: Not done Hip scour (SN 50): R: Negative L: Negative   Piriformis Syndrome: PACE Sign: Negative     Sensation Impaired light touch medial malleolus/digits in L>RLE to light touch bilateral LEs. Proprioception, and hot/cold testing deferred on this date.     Reflexes Deferred     Cranial Nerves Visual acuity and visual fields are intact  -convergence insufficiency  Extraocular muscles are intact  Facial sensation is intact bilaterally  Facial strength is intact bilaterally  Hearing is normal as tested by gross conversation Palate elevates midline, normal phonation  Shoulder shrug strength is intact  Tongue protrudes deviates to R side      Coordination/Cerebellar Finger to Nose: Intermittent hypometria  Heel to Shin: WNL  Rapid alternating movements: WNL Finger Opposition: WNL Pronator Drift: Negative        FUNCTIONAL OUTCOME MEASURES INITIAL EVAL   Results Comments  BERG 11/23/22: 51/56 Fall risk, in need of intervention  DGI 11/23/22: 16/24    TUG 11/23/22: 9.9 seconds    5TSTS 11/23/22: 21.8 seconds    6 Minute Walk Test 11/23/22: 735 ft    (Blank rows = not tested)         TODAY'S TREATMENT      Neuromuscular Re-education - for improved sensory  integration, dynamic balance, equilibrium and non-equilibrium coordination as needed for negotiating home and community environment and stepping over obstacles  Performance of BERG and DGI* (see updates in Goals section)   *not today* High knees, x4 D/B Forward hurdle step, (3) 6-inch hurdles and (2) 12-inch hurdles, consecutive; x5 D/B In hallway: Obstacle course in hallway: 5 cones R and L alternating cone tap, long Airex Tandem walk, 6-inch step up and down; x3 D/B  Forward step-up with hurdle stance + holding unipedal stance x 3 sec at top; 1x10 on 6-inch step (using staircase in center of gym)   -for improved stability with stair/curb/obstacle negotiation and dynamic unipedal stance Unipedal stance; 2 x 10-15 sec, bilaterally; standing adjacent to treadmill for upper body support prn Forward lunge; 1x10 with each LE Forward step-up to 6-inch step + Airex; 2x10 with bilateral LE Forward stepping with visual targets on floor, blue agility ladder; x 5 D/B with verbal cueing for increased step length and heel strike    Therapeutic Exercise - improved cardiovascular/neuromuscular endurance to improve community-level mobility, improved strength as needed to improve performance of CKC activities/functional movements and as needed for power production to prevent fall during episode of large postural perturbation   REASSESSMENT PERFORMED (see Objective section)     PATIENT EDUCATION:   *not today* NuStep; Level 2, x 5 minutes, seat at 8 Forward step-up; 6-inch step; 1x10 with each LE, no UE support on handrails  Sit to stand; 2x10, from standard chair with arms crossed over chest 3-way hip adjacent to treadmill armrest; 2x8 ea LE     PATIENT EDUCATION:  Education details: see above for patient education details Person educated: Patient Education method: Explanation Education comprehension: verbalized understanding     HOME EXERCISE PROGRAM: Access Code: [email or web address] URL:  [email or web address] Date: 12/26/2022 Prepared by: Valentina Gu  Exercises - Sit to Stand Without Arm Support  - 2 x daily - 7 x weekly - 2 sets - 10 reps - Standing 3-Way Kick  - 2 x daily - 7 x weekly - 2 sets - 10 reps - Forward Step Up with Unilateral Counter Support  - 2 x daily - 7 x weekly - 2 sets - 10 reps - Standing Single Leg Stance with Counter Support  - 1 x daily - 7 x weekly - 3 sets - 10-20 sec hold - Side Stepping with Resistance at Thighs and Counter Support  - 1 x daily - 4 x weekly - 1 sets - 4-6 reps - Forward Lunge with Counter Support  - 1 x daily - 4 x weekly - 2 sets - 10 reps      ASSESSMENT:   CLINICAL IMPRESSION:  Patient has met almost all established goals related to imbalance/fall risk. We were not able to get to 6-minute walk test today to check for aerobic endurance. We will complete this next visit. Patient does feel that steadiness/balance has remarkably improved and she reports significantly higher  ABC scale. Patient has ongoing complaints of back pain and has limited her activity due to lumbar/flank pain; pt has new referral for cervicalgia and low back pain. We discussed moving forward next visit with assessment for lumbar spine and completing more targeted therapy for low back pain given notable improvement in balance and low relative fall risk at this time. Patient's condition may change over time with progressive nature of MS, but pt is able to access home and community with relatively low fall risk at this time; we discussed following up with her physician and informing PT if she feels that her balance does worsen. Pt is appropriate for transition in focus of PT to low back given new referral and progress made to date.  Patient will benefit from continued skilled therapeutic intervention to address the above deficits as needed for improved function and QoL.     REHAB POTENTIAL: Good   CLINICAL DECISION MAKING: Evolving/moderate  complexity   EVALUATION COMPLEXITY: Moderate     GOALS: Goals reviewed with patient? No   SHORT TERM GOALS: Target date: 12/13/2022   Pt will be independent with HEP in order to improve strength and balance in order to decrease fall risk and improve function at home. Baseline: 11/21/22: Baseline HEP initiated.    01/09/23: Pt is able to complete HEP on most days, held on days in which she had notable spasm or wasn't feeling well  Goal status: ACHIEVED       LONG TERM GOALS: Target date: 02/02/2023   Pt will increase FOTO for balance to at least 44 to demonstrate significant improvement in function at home related to balance  Baseline: 11/21/22: 34.      01/09/23: 60/44 Goal status: ACHIEVED   2.  Pt will improve BERG by at least 3 points in order to demonstrate clinically significant improvement in balance.   Baseline: 11/21/22: To be obtained on visit # 2.       11/23/22: 51/56     01/09/23: 56/56 Goal status: ACHIEVED   3.  Pt will improve ABC by at least 13% in order to demonstrate clinically significant improvement in balance confidence.      Baseline: 11/21/22: To be obtained on visit # 2.    11/23/22: 43.1%     01/09/23: 94.4% Goal status: ACHIEVED   4. Pt will decrease 5TSTS by at least 3 seconds in order to demonstrate clinically significant improvement in LE strength      Baseline: 11/21/22: 21.8 seconds.     01/09/23: 17.31 Goal status: ACHIEVED    5. Pt will improve DGI by at least 3 points in order to demonstrate clinically significant improvement in balance and decreased risk for falls.     Baseline: 11/21/22: To be performed on visit # 2.   11/23/22: 16/24.     01/09/23: 21/24 Goal status: ACHIEVED   6. Pt will increase 6MWT by at least 66m(1636f in order to demonstrate clinically significant improvement in cardiopulmonary endurance and community-level ambulation   Baseline: 11/21/22: To be performed on visit # 2.   11/23/22: [id number]     01/09/23: 1155 ft  Goal status: ACHIEVED  7. Pt  will increase FOTO for lumbar spine to at least 43 to demonstrate significant improvement in function at home related to balance  Baseline:  01/16/23: 33 Goal status: ACHIEVED    8. Patient will have full thoracolumbar AROM without reproduction of pain as needed for reaching items on ground, household chores, bending.  Baseline:  01/16/23: Pain and motion loss with flexion, extension, right lateral flexion; stiffness bilat rotation.  Goal status: ACHIEVED    PLAN: PT FREQUENCY: 2x/week   PT DURATION: 6 weeks   PLANNED INTERVENTIONS: Therapeutic exercises, Therapeutic activity, Neuromuscular re-education, Balance training, Gait training, Patient/Family education, Electrical stimulation, Cryotherapy, and Manual therapy   PLAN FOR NEXT SESSION: Continue with manual therapy and exercise focused on low back with updated HEP for lower quarter impairments.    Valentina Gu, PT, DPT [id number]  Eilleen Kempf, PT 01/16/2023, 10:38 AM

## 2023-01-23 ENCOUNTER — Ambulatory Visit: Payer: Medicaid Other | Admitting: Physical Therapy

## 2023-01-23 ENCOUNTER — Encounter: Payer: Medicaid Other | Admitting: Physical Therapy

## 2023-01-23 DIAGNOSIS — M6281 Muscle weakness (generalized): Secondary | ICD-10-CM | POA: Diagnosis not present

## 2023-01-23 DIAGNOSIS — M5459 Other low back pain: Secondary | ICD-10-CM

## 2023-01-23 DIAGNOSIS — R293 Abnormal posture: Secondary | ICD-10-CM

## 2023-01-23 NOTE — Therapy (Addendum)
OUTPATIENT PHYSICAL THERAPY TREATMENT   Patient Name: Amy Hubbard MRN: [id number] DOB:November 02, 1971, 52 y.o., female Today's Date: 01/23/2023  PCP: Barbaraann Boys, MD  REFERRING PROVIDER: Collene Gobble, MD   END OF SESSION:   PT End of Session - 01/28/23 0718     Visit Number 2    Number of Visits 13    Date for PT Re-Evaluation 02/27/23    Authorization Type re-assess for lumbar spine 01/16/23    Progress Note Due on Visit 10    PT Start Time 0821    PT Stop Time 0910    PT Time Calculation (min) 49 min    Equipment Utilized During Treatment Gait belt    Activity Tolerance Patient tolerated treatment well    Behavior During Therapy WFL for tasks assessed/performed                Past Medical History:  Diagnosis Date   Anxiety    Complication of anesthesia    PONV (postoperative nausea and vomiting)    Uterine fibroid    Past Surgical History:  Procedure Laterality Date   ABDOMINAL HYSTERECTOMY     BREAST BIOPSY Right 03/17/2017   fibroadenoma, bx/clip   CESAREAN SECTION N/A 10/29/2005   IUD REMOVAL     LAPAROSCOPIC VAGINAL HYSTERECTOMY WITH SALPINGECTOMY Bilateral 05/31/2019   Procedure: LAPAROSCOPIC ASSISTED VAGINAL HYSTERECTOMY WITH SALPINGECTOMY;  Surgeon: Boykin Nearing, MD;  Location: [facility] OR[facility];  Service: Gynecology;  Laterality: Bilateral;   Patient Active Problem List   Diagnosis Date Noted   Postoperative state 05/31/2019    REFERRING DIAG: Multiple sclerosis (CMS-HCC)  Back spasm  Impaired functional mobility, balance, gait, and endurance  Cervicalgia   THERAPY DIAG:  Other low back pain  Muscle weakness (generalized)  Abnormal posture  Rationale for Evaluation and Treatment Rehabilitation  PERTINENT HISTORY: Patient is a 52 year old female referred for impaired gait and balance with history of MS. Pt has f/u with neurology tomorrow. She feels like her symptoms have been more significant since late this past year, most notably  with flare-up around Thanksgiving. Pt reports some neck pain, nausea. Pt was seeing Myles Gip, DPT before for pelvic health needs and also underwent previous vestibular evaluation with Roxana Hires, DPT, [facility] prior to her MS diagnosis. Pt reports she has dealt with notable fatigue and has been sleeping more. Pt is undergoing OCREVUS infusions to treat MS. Pt has migraine disorder and undergoes Aimovig injection for this. Pt reports dizziness with rapid change in head position. Pt reports visual changes recently (some diplopia and difficulty focusing), memory loss. Pt reports intermittent stumbling into her wall when going down hallway.      Pain: Yes, headache and burning diffusely "all over," varies in location  Numbness/Tingling: Yes, numbness/tingling in her hands subtly  Focal Weakness: Yes, sometimes she has intermittent buckling LLE, intermittent loss of grip in her right hand; weakness associated with back pain  Recent changes in overall health/medication: Yes Prior history of physical therapy for balance:  No  Falls: Has patient fallen in last 6 months? No, Number of falls: N/A   Imaging: Yes    MR Brain 05/06/22 Chronic white matter changes stable since 2021. The appearance is nonspecific and could be seen with chronic demyelinating disease or chronic ischemia. Correlate with neurologic symptoms of multiple sclerosis.   Prior level of function: Independent with basic ADLs Occupational demands:Pt out of work now, applying for disability  Hobbies: Able to walk outside, walking to her mailbox  Red flags (bowel/bladder changes, saddle paresthesia, personal history of cancer, h/o spinal tumors, h/o compression fx, h/o abdominal aneurysm, abdominal pain, chills/fever, night sweats, nausea, vomiting, unrelenting pain): Positive for night pain, history of positive breat biopsy      Weight Bearing Restrictions: No   Living Environment Lives with: lives with one of her daughters, pt is  close with her neighbors Lives in: House/apartment One step to get in front door; bilateral railings to get up back steps (6-7 steps in back). [place] on her yard Has following equipment at home: Single point cane     Patient Goals: How to manage imbalance issues     SUBJECTIVE FOR LUMBAR SPINE (01/16/23): Pt originally referred for MS and imbalance; she has updated PT referral with [facility] including cervicalgia and back spasm. Patient reports her fatigue has been better.She had to stop Oxcarbazepine due to allergic reaction. She feels that her back has improved modestly. Pt reports using topical menthol treatment on her back Saturday due to notable pain. Pt has nerve ablation on 02/06/23 for low back pain. Pt feels her pain alternates from one side to the other. She reports pain in her legs historically, but not over the last week. She states leg that is more affected can alternate side to side. Patient reports disturbed sleep before with back pain - "not in a couple of weeks." No bowel/bladder changes. She feels her balance is improved substantially and she is ready to focus on other needs. Aggravated by: sitting, prolonged standing (e.g. preparing food). Relieved by: lying down on her side (usually left), Epsom salt bath. She states she did have numbness/tingling in her legs (sometimes in feet, sometimes in fingertips), but not as much now. She reports feeling "stiff/sore" presently.     PRECAUTIONS: Fall risk     SUBJECTIVE:                                                                                                                                                                                      SUBJECTIVE STATEMENT:  Pt reports noticing her fatigue more this AM. She reports notable tingling and intermittent stabbing affecting R side of her back.  4/10 pain overall, 7/10 pain affecting R side of back specifically at arrival. Pt reports compliance with HEP. Pt took tizanidine at  4 AM today and feels that it didn't help with her symptoms; notably irritated first thing today.   PAIN:  Are you having pain? Yes, 7/10 pain in R side of low back  Pain location: Across low back, up to thoracic spine, down to glutes    OBJECTIVE: (objective measures completed at initial evaluation unless otherwise  dated, re-assessment for lumbar spine on 01/16/23 included below)  Surveys IMBALANCE FOTO: 34, predicted improvement to 44 ABC: 43.1% Modified Fatigue Impact Scale: 83/84 LUMBAR SPINE  FOTO: 33, predicted improvement to 43      GAIT: Assistive device utilized: None Level of assistance: CGA Comments: Patient tends to deviate to left during forward gait, shortened step length bilaterally, decreased hip extension, L hip circumduction      Posture: Pt rests in kyphotic posture, posterior pelvic tilt     AROM AROM (Normal range in degrees) AROM   Lumbar   Flexion (65) 25%* (pain in back)  Extension (30) 50%* (axial pain in back)  Right lateral flexion (25) 75%*  Left lateral flexion (25) 100%  Right rotation (30) 25%*  Left rotation (30) 50% "tight"      Hip Right Left  Flexion (125) 110 110  Extension (15)    Abduction (40) WNL WNL  Adduction     Internal Rotation (45) WNL WNL  External Rotation (45) WNL WNL       Knee    Flexion (135) WNL WNL  Extension (0) WNL WNL      (* = pain; Blank rows = not tested)    LE MMT:   MMT (out of 5) Right 11/22/2022 Left 11/22/2022 Right 01/16/23 Left 01/16/23  Hip flexion 4- 4- 4-* 4-  Hip extension        Hip abduction 4 4 5  (seated) 5 (seated)   Hip adduction 5 5    Hip internal rotation        Hip external rotation        Knee flexion 5 5 4+ 5  Knee extension 5 4+ 5 5  Ankle dorsiflexion 4+ 4+ 5 5  Ankle plantarflexion        Ankle inversion        Ankle eversion        (* = pain; Blank rows = not tested)    Heel walking and toe walking WNL    Muscle Length Hamstrings: R: Positive L: Positive Ely  (quadriceps): R: Negative L: Negative Thomas (hip flexors): R: Not done L: Not done      Palpation Location Right Left         Lumbar paraspinals 2 1  Quadratus Lumborum 0 0  Gluteus Maximus 1 0  Gluteus Medius 1 0  Deep hip external rotators 0 0  PSIS 1 1  Fortin's Area (SIJ)    Greater Trochanter    (Blank rows = not tested) Graded on 0-4 scale (0 = no pain, 1 = pain, 2 = pain with wincing/grimacing/flinching, 3 = pain with withdrawal, 4 = unwilling to allow palpation)  Passive Accessory Intervertebral Motion Reproduction of back pain with CPA L3-S1 with pain prior to restriction, empty end-feel  Special Tests Lumbar Radiculopathy and Discogenic: Centralization and Peripheralization (SN 92, -LR 0.12): Modest abatement of symptoms with prone on elbows, no centralization today Slump (SN 83, -LR 0.32): R: Positive L: Positive SLR (SN 92, -LR 0.29): R: Positive L:  Negative   Facet Joint: Extension-Rotation (SN 100, -LR 0.0): R: Positive L: Negative  Lumbar Foraminal Stenosis: Lumbar quadrant (SN 70): R: Negative L: Negative  Hip: FABER (SN 81): R: Positive L: Negative FADIR (SN 94): R: Not done L: Not done Hip scour (SN 50): R: Negative L: Negative  Piriformis Syndrome: PACE Sign: Negative   Sensation Impaired light touch medial malleolus/digits in L>RLE to light touch bilateral LEs. Proprioception, and hot/cold  testing deferred on this date.     Reflexes Deferred     Cranial Nerves Visual acuity and visual fields are intact  -convergence insufficiency  Extraocular muscles are intact  Facial sensation is intact bilaterally  Facial strength is intact bilaterally  Hearing is normal as tested by gross conversation Palate elevates midline, normal phonation  Shoulder shrug strength is intact  Tongue protrudes deviates to R side      Coordination/Cerebellar Finger to Nose: Intermittent hypometria  Heel to Shin: WNL Rapid alternating movements: WNL Finger  Opposition: WNL Pronator Drift: Negative        FUNCTIONAL OUTCOME MEASURES INITIAL EVAL   Results Comments  BERG 11/23/22: 51/56 Fall risk, in need of intervention  DGI 11/23/22: 16/24    TUG 11/23/22: 9.9 seconds    5TSTS 11/23/22: 21.8 seconds    6 Minute Walk Test 11/23/22: 735 ft    (Blank rows = not tested)         TODAY'S TREATMENT     01/23/2023     Manual Therapy - for symptom modulation, soft tissue sensitivity and mobility, joint mobility, ROM  Manual lumbar traction with patient in hooklying; 10 sec on, 5 sec off; for nerve root decompression and symptom modulation  -x 10 minutes with intermittent feedback on symptoms in back versus LE    Therapeutic Exercise - sustained positions and repeated movements to modulate symptoms and improve ROM, thoracolumbar ROM and mobility, posterior chain soft tissue extensibility   Prone on elbows; 2 x 2 minutes  -increase in R-sided back pain, remaining paresthesias halfway down her RLE posteriorly   -allowed for 1-minute rest break in prone lying  Lower trunk rotations; x10 alternating R/L, 1 sec hold on either side Hooklying sciatic nerve floss; 2x10 with bilat LE, blanket around thigh to hold leg versus holding with hands alone   Piriformis stretch; x30 sec  -for HEP review, discussed technique and use of blanket/towel to help with pulling leg prn    PATIENT EDUCATION: HEP update and review. Discussed centralization phenomenon and expectations with use of EIL with axial symptoms versus referred leg pain.      Cold pack (unbilled) - for anti-inflammatory and analgesic effect as needed for reduced pain and improved ability to participate in active PT intervention, along low back for 5 minutes in prone lying, x 5 minutes    PATIENT EDUCATION:  Education details: see above for patient education details Person educated: Patient Education method: Explanation Education comprehension: verbalized understanding     HOME  EXERCISE PROGRAM: IMBALANCE Access Code: [email or web address] URL: [email or web address] Date: 12/26/2022 Prepared by: Valentina Gu  Exercises - Sit to Stand Without Arm Support  - 2 x daily - 7 x weekly - 2 sets - 10 reps - Standing 3-Way Kick  - 2 x daily - 7 x weekly - 2 sets - 10 reps - Forward Step Up with Unilateral Counter Support  - 2 x daily - 7 x weekly - 2 sets - 10 reps - Standing Single Leg Stance with Counter Support  - 1 x daily - 7 x weekly - 3 sets - 10-20 sec hold - Side Stepping with Resistance at Thighs and Counter Support  - 1 x daily - 4 x weekly - 1 sets - 4-6 reps - Forward Lunge with Counter Support  - 1 x daily - 4 x weekly - 2 sets - 10 reps  LUMBAR SPINE Access Code: [email or web address] URL: [email or web address] Date: 01/23/2023 Prepared by: Valentina Gu  Exercises -  Static Prone on Elbows  - 4 x daily - 7 x weekly - 1 sets - 3-63min hold - Supine Piriformis Stretch with Foot on Ground  - 2 x daily - 7 x weekly - 3 sets - 30sec hold - Supine 90/90 Sciatic Nerve Glide with Knee Flexion/Extension  - 2 x daily - 7 x weekly - 2 sets - 10 reps - 1sec hold - Lower Trunk Rotations  - 2 x daily - 7 x weekly - 2 sets - 10 reps - 1-2sec hold      ASSESSMENT:   CLINICAL IMPRESSION: Patient arrives with excellent motivation to participate in physical therapy. She has ongoing significant pain affecting low back. We tested prone on elbows today with report of remaining axial pain without notable gluteal/flank pain. We updated her program to include piriformis stretching and neurodynamics.  Pain experience is also affected by possible paresthesia and neuralgias associated with MS; pt has experienced some increased fatigue over last week due to MS. Patient has remaining deficits in gross thoracolumbar AROM loss in all directions, mild posterior chain tightness and decreased dural mobility for sciatic n., and low back pain with intermittent LE referral. Patient  will benefit from continued skilled therapeutic intervention to address the above deficits as needed for improved function and QoL.    REHAB POTENTIAL: Good   CLINICAL DECISION MAKING: Evolving/moderate complexity   EVALUATION COMPLEXITY: Moderate     GOALS: Goals reviewed with patient? No   SHORT TERM GOALS: Target date: 12/13/2022   Pt will be independent with HEP in order to improve strength and balance in order to decrease fall risk and improve function at home. Baseline: 11/21/22: Baseline HEP initiated.    01/09/23: Pt is able to complete HEP on most days, held on days in which she had notable spasm or wasn't feeling well  Goal status: ACHIEVED       LONG TERM GOALS: Target date: 02/02/2023   Pt will increase FOTO for balance to at least 44 to demonstrate significant improvement in function at home related to balance  Baseline: 11/21/22: 34.      01/09/23: 60/44 Goal status: ACHIEVED   2.  Pt will improve BERG by at least 3 points in order to demonstrate clinically significant improvement in balance.   Baseline: 11/21/22: To be obtained on visit # 2.       11/23/22: 51/56     01/09/23: 56/56 Goal status: ACHIEVED   3.  Pt will improve ABC by at least 13% in order to demonstrate clinically significant improvement in balance confidence.      Baseline: 11/21/22: To be obtained on visit # 2.    11/23/22: 43.1%     01/09/23: 94.4% Goal status: ACHIEVED   4. Pt will decrease 5TSTS by at least 3 seconds in order to demonstrate clinically significant improvement in LE strength      Baseline: 11/21/22: 21.8 seconds.     01/09/23: 17.31 Goal status: ACHIEVED    5. Pt will improve DGI by at least 3 points in order to demonstrate clinically significant improvement in balance and decreased risk for falls.     Baseline: 11/21/22: To be performed on visit # 2.   11/23/22: 16/24.     01/09/23: 21/24 Goal status: ACHIEVED   6. Pt will increase 6MWT by at least 50m (162ft) in order to demonstrate clinically  significant improvement in cardiopulmonary endurance and community-level ambulation   Baseline: 11/21/22: To be performed on visit # 2.  11/23/22: 735 ft     01/09/23: 1155 ft  Goal status: ACHIEVED  7. Pt will increase FOTO for lumbar spine to at least 43 to demonstrate significant improvement in function at home related to balance  Baseline:  01/16/23: 33 Goal status: INITIAL   8. Patient will have full thoracolumbar AROM without reproduction of pain as needed for reaching items on ground, household chores, bending.  Baseline:  01/16/23: Pain and motion loss with flexion, extension, right lateral flexion; stiffness bilat rotation.  Goal status: INITIAL   PLAN: PT FREQUENCY: 2x/week   PT DURATION: 6 weeks   PLANNED INTERVENTIONS: Therapeutic exercises, Therapeutic activity, Neuromuscular re-education, Patient/Family education, Electrical stimulation, Dry Needling, Cryotherapy, and Manual therapy   PLAN FOR NEXT SESSION: Continue with manual therapy and exercise focused on low back with updated HEP for lower quarter impairments.  Traction at future sessions as needed. Re-assess response to extension in lying.   Valentina Gu, PT, DPT [id number]  Eilleen Kempf, PT 01/28/2023, 7:18 AM

## 2023-01-28 ENCOUNTER — Encounter: Payer: Self-pay | Admitting: Physical Therapy

## 2023-01-29 ENCOUNTER — Ambulatory Visit: Payer: Medicaid Other

## 2023-01-30 ENCOUNTER — Encounter: Payer: Self-pay | Admitting: Physical Therapy

## 2023-01-30 ENCOUNTER — Ambulatory Visit: Payer: Medicaid Other | Admitting: Physical Therapy

## 2023-01-30 DIAGNOSIS — M6281 Muscle weakness (generalized): Secondary | ICD-10-CM

## 2023-01-30 DIAGNOSIS — R293 Abnormal posture: Secondary | ICD-10-CM

## 2023-01-30 DIAGNOSIS — M5459 Other low back pain: Secondary | ICD-10-CM

## 2023-01-30 NOTE — Therapy (Signed)
OUTPATIENT PHYSICAL THERAPY TREATMENT   Patient Name: Amy Hubbard MRN: [id number] DOB:11/30/1970, 52 y.o., female Today's Date: 01/30/2023  PCP: Barbaraann Boys, MD  REFERRING PROVIDER: Collene Gobble, MD   END OF SESSION:   PT End of Session - 01/30/23 0827     Visit Number 3    Number of Visits 13    Date for PT Re-Evaluation 02/27/23    Authorization Type re-assess for lumbar spine 01/16/23    Progress Note Due on Visit 10    PT Start Time 0821    PT Stop Time 0902    PT Time Calculation (min) 41 min    Equipment Utilized During Treatment Gait belt    Activity Tolerance Patient tolerated treatment well    Behavior During Therapy WFL for tasks assessed/performed                 Past Medical History:  Diagnosis Date   Anxiety    Complication of anesthesia    PONV (postoperative nausea and vomiting)    Uterine fibroid    Past Surgical History:  Procedure Laterality Date   ABDOMINAL HYSTERECTOMY     BREAST BIOPSY Right 03/17/2017   fibroadenoma, bx/clip   CESAREAN SECTION N/A 10/29/2005   IUD REMOVAL     LAPAROSCOPIC VAGINAL HYSTERECTOMY WITH SALPINGECTOMY Bilateral 05/31/2019   Procedure: LAPAROSCOPIC ASSISTED VAGINAL HYSTERECTOMY WITH SALPINGECTOMY;  Surgeon: Boykin Nearing, MD;  Location: [facility] ORS;  Service: Gynecology;  Laterality: Bilateral;   Patient Active Problem List   Diagnosis Date Noted   Postoperative state 05/31/2019    REFERRING DIAG: Multiple sclerosis (CMS-HCC)  Back spasm  Impaired functional mobility, balance, gait, and endurance  Cervicalgia   THERAPY DIAG:  Other low back pain  Muscle weakness (generalized)  Abnormal posture  Rationale for Evaluation and Treatment Rehabilitation  PERTINENT HISTORY: Patient is a 52 year old female referred for impaired gait and balance with history of MS. Pt has f/u with neurology tomorrow. She feels like her symptoms have been more significant since late this past year, most notably  with flare-up around Thanksgiving. Pt reports some neck pain, nausea. Pt was seeing Myles Gip, DPT before for pelvic health needs and also underwent previous vestibular evaluation with Roxana Hires, DPT, [facility] prior to her MS diagnosis. Pt reports she has dealt with notable fatigue and has been sleeping more. Pt is undergoing OCREVUS infusions to treat MS. Pt has migraine disorder and undergoes Aimovig injection for this. Pt reports dizziness with rapid change in head position. Pt reports visual changes recently (some diplopia and difficulty focusing), memory loss. Pt reports intermittent stumbling into her wall when going down hallway.      Pain: Yes, headache and burning diffusely "all over," varies in location  Numbness/Tingling: Yes, numbness/tingling in her hands subtly  Focal Weakness: Yes, sometimes she has intermittent buckling LLE, intermittent loss of grip in her right hand; weakness associated with back pain  Recent changes in overall health/medication: Yes Prior history of physical therapy for balance:  No  Falls: Has patient fallen in last 6 months? No, Number of falls: N/A   Imaging: Yes    MR Brain 05/06/22 Chronic white matter changes stable since 2021. The appearance is nonspecific and could be seen with chronic demyelinating disease or chronic ischemia. Correlate with neurologic symptoms of multiple sclerosis.   Prior level of function: Independent with basic ADLs Occupational demands:Pt out of work now, applying for disability  Hobbies: Able to walk outside, walking to her mailbox  Red flags (bowel/bladder changes, saddle paresthesia, personal history of cancer, h/o spinal tumors, h/o compression fx, h/o abdominal aneurysm, abdominal pain, chills/fever, night sweats, nausea, vomiting, unrelenting pain): Positive for night pain, history of positive breat biopsy      Weight Bearing Restrictions: No   Living Environment Lives with: lives with one of her daughters, pt is  close with her neighbors Lives in: House/apartment One step to get in front door; bilateral railings to get up back steps (6-7 steps in back). [place] on her yard Has following equipment at home: Single point cane     Patient Goals: How to manage imbalance issues     SUBJECTIVE FOR LUMBAR SPINE (01/16/23): Pt originally referred for MS and imbalance; she has updated PT referral with [facility] including cervicalgia and back spasm. Patient reports her fatigue has been better.She had to stop Oxcarbazepine due to allergic reaction. She feels that her back has improved modestly. Pt reports using topical menthol treatment on her back Saturday due to notable pain. Pt has nerve ablation on 02/06/23 for low back pain. Pt feels her pain alternates from one side to the other. She reports pain in her legs historically, but not over the last week. She states leg that is more affected can alternate side to side. Patient reports disturbed sleep before with back pain - "not in a couple of weeks." No bowel/bladder changes. She feels her balance is improved substantially and she is ready to focus on other needs. Aggravated by: sitting, prolonged standing (e.g. preparing food). Relieved by: lying down on her side (usually left), Epsom salt bath. She states she did have numbness/tingling in her legs (sometimes in feet, sometimes in fingertips), but not as much now. She reports feeling "stiff/sore" presently.     PRECAUTIONS: Fall risk     SUBJECTIVE:                                                                                                                                                                                      SUBJECTIVE STATEMENT:  Pt reports significant pain affecting her R shoulder and down her R upper limb without paresthesias. She reports waking up Saturday with severe upper limb pain without specific aggravating activity/aggravating factor. She reports minimal back pain at this time. She  reports refraining from some exercise at home due to R upper quarter pain.   PAIN:  Are you having pain? No Pain location: Across low back, up to thoracic spine, down to glutes    OBJECTIVE: (objective measures completed at initial evaluation unless otherwise dated, re-assessment for lumbar spine on 01/16/23 included below)  Surveys IMBALANCE FOTO: 34, predicted improvement to  44 ABC: 43.1% Modified Fatigue Impact Scale: 83/84 LUMBAR SPINE  FOTO: 33, predicted improvement to 43      GAIT: Assistive device utilized: None Level of assistance: CGA Comments: Patient tends to deviate to left during forward gait, shortened step length bilaterally, decreased hip extension, L hip circumduction      Posture: Pt rests in kyphotic posture, posterior pelvic tilt     AROM AROM (Normal range in degrees) AROM   Lumbar   Flexion (65) 25%* (pain in back)  Extension (30) 50%* (axial pain in back)  Right lateral flexion (25) 75%*  Left lateral flexion (25) 100%  Right rotation (30) 25%*  Left rotation (30) 50% "tight"      Hip Right Left  Flexion (125) 110 110  Extension (15)    Abduction (40) WNL WNL  Adduction     Internal Rotation (45) WNL WNL  External Rotation (45) WNL WNL       Knee    Flexion (135) WNL WNL  Extension (0) WNL WNL      (* = pain; Blank rows = not tested)    LE MMT:   MMT (out of 5) Right 11/22/2022 Left 11/22/2022 Right 01/16/23 Left 01/16/23  Hip flexion 4- 4- 4-* 4-  Hip extension        Hip abduction 4 4 5  (seated) 5 (seated)   Hip adduction 5 5    Hip internal rotation        Hip external rotation        Knee flexion 5 5 4+ 5  Knee extension 5 4+ 5 5  Ankle dorsiflexion 4+ 4+ 5 5  Ankle plantarflexion        Ankle inversion        Ankle eversion        (* = pain; Blank rows = not tested)    Heel walking and toe walking WNL    Muscle Length Hamstrings: R: Positive L: Positive Ely (quadriceps): R: Negative L: Negative Thomas (hip flexors):  R: Not done L: Not done      Palpation Location Right Left         Lumbar paraspinals 2 1  Quadratus Lumborum 0 0  Gluteus Maximus 1 0  Gluteus Medius 1 0  Deep hip external rotators 0 0  PSIS 1 1  Fortin's Area (SIJ)    Greater Trochanter    (Blank rows = not tested) Graded on 0-4 scale (0 = no pain, 1 = pain, 2 = pain with wincing/grimacing/flinching, 3 = pain with withdrawal, 4 = unwilling to allow palpation)  Passive Accessory Intervertebral Motion Reproduction of back pain with CPA L3-S1 with pain prior to restriction, empty end-feel  Special Tests Lumbar Radiculopathy and Discogenic: Centralization and Peripheralization (SN 92, -LR 0.12): Modest abatement of symptoms with prone on elbows, no centralization today Slump (SN 83, -LR 0.32): R: Positive L: Positive SLR (SN 92, -LR 0.29): R: Positive L:  Negative   Facet Joint: Extension-Rotation (SN 100, -LR 0.0): R: Positive L: Negative  Lumbar Foraminal Stenosis: Lumbar quadrant (SN 70): R: Negative L: Negative  Hip: FABER (SN 81): R: Positive L: Negative FADIR (SN 94): R: Not done L: Not done Hip scour (SN 50): R: Negative L: Negative  Piriformis Syndrome: PACE Sign: Negative   Sensation Impaired light touch medial malleolus/digits in L>RLE to light touch bilateral LEs. Proprioception, and hot/cold testing deferred on this date.     Reflexes Deferred     Cranial Nerves  Visual acuity and visual fields are intact  -convergence insufficiency  Extraocular muscles are intact  Facial sensation is intact bilaterally  Facial strength is intact bilaterally  Hearing is normal as tested by gross conversation Palate elevates midline, normal phonation  Shoulder shrug strength is intact  Tongue protrudes deviates to R side      Coordination/Cerebellar Finger to Nose: Intermittent hypometria  Heel to Shin: WNL Rapid alternating movements: WNL Finger Opposition: WNL Pronator Drift: Negative         FUNCTIONAL OUTCOME MEASURES INITIAL EVAL   Results Comments  BERG 11/23/22: 51/56 Fall risk, in need of intervention  DGI 11/23/22: 16/24    TUG 11/23/22: 9.9 seconds    5TSTS 11/23/22: 21.8 seconds    6 Minute Walk Test 11/23/22: 735 ft    (Blank rows = not tested)         TODAY'S TREATMENT     01/30/2023     Manual Therapy - for symptom modulation, soft tissue sensitivity and mobility, joint mobility, ROM  Manual lumbar traction with patient in hooklying; 10 sec on, 5 sec off; for nerve root decompression and symptom modulation  -x 10 minutes with intermittent feedback on symptoms in back versus LE  STM/DTM R lumber erector spinae L3-L5 x 5 minutes   Therapeutic Exercise - sustained positions and repeated movements to modulate symptoms and improve ROM, thoracolumbar ROM and mobility, posterior chain soft tissue extensibility   Prone on elbows; 1 x 2 minutes  Mild pressure along low back, no significant pain when assuming prone lying afterward Lower trunk rotations; x10 alternating R/L, 1 sec hold on either side Anterior pelvic tilt, supine; 2x10  Hooklying sciatic nerve floss; reviewed  Piriformis stretch; reviewed alternative technique in sitting to improve compliance  PATIENT EDUCATION: Discussed continued HEP and possibility for transitioning to focus on upper quarter complaints. We discussed following up with MD regarding new/worsening symptoms.      Cold pack (unbilled) - for anti-inflammatory and analgesic effect as needed for reduced pain and improved ability to participate in active PT intervention, along low back for 5 minutes in prone lying, x 5 minutes    PATIENT EDUCATION:  Education details: see above for patient education details Person educated: Patient Education method: Explanation Education comprehension: verbalized understanding     HOME EXERCISE PROGRAM: IMBALANCE Access Code: [email or web address] URL: [email or web address] Date:  12/26/2022 Prepared by: Valentina Gu  Exercises - Sit to Stand Without Arm Support  - 2 x daily - 7 x weekly - 2 sets - 10 reps - Standing 3-Way Kick  - 2 x daily - 7 x weekly - 2 sets - 10 reps - Forward Step Up with Unilateral Counter Support  - 2 x daily - 7 x weekly - 2 sets - 10 reps - Standing Single Leg Stance with Counter Support  - 1 x daily - 7 x weekly - 3 sets - 10-20 sec hold - Side Stepping with Resistance at Thighs and Counter Support  - 1 x daily - 4 x weekly - 1 sets - 4-6 reps - Forward Lunge with Counter Support  - 1 x daily - 4 x weekly - 2 sets - 10 reps  LUMBAR SPINE Access Code: [email or web address] URL: [email or web address] Date: 01/23/2023 Prepared by: Valentina Gu  Exercises - Static Prone on Elbows  - 4 x daily - 7 x weekly - 1 sets - 3-58min hold - Supine Piriformis Stretch with Foot on Ground  - 2 x daily - 7 x weekly -  3 sets - 30sec hold - Supine 90/90 Sciatic Nerve Glide with Knee Flexion/Extension  - 2 x daily - 7 x weekly - 2 sets - 10 reps - 1sec hold - Lower Trunk Rotations  - 2 x daily - 7 x weekly - 2 sets - 10 reps - 1-2sec hold      ASSESSMENT:   CLINICAL IMPRESSION: Patient fortunately has minimal low back pain this afternoon. She does have mild sensitivity remaining along R lumbar paraspinal region. She will be following up with physician regarding upper quarter condition. We may transition to focus on cervicalgia and possible referred C-spine pain if lower quarter condition is under control. Patient has remaining deficits in gross thoracolumbar AROM loss in all directions, mild posterior chain tightness and decreased dural mobility for sciatic n., and low back pain with intermittent LE referral. Patient will benefit from continued skilled therapeutic intervention to address the above deficits as needed for improved function and QoL.    REHAB POTENTIAL: Good   CLINICAL DECISION MAKING: Evolving/moderate complexity   EVALUATION  COMPLEXITY: Moderate     GOALS: Goals reviewed with patient? No   SHORT TERM GOALS: Target date: 12/13/2022   Pt will be independent with HEP in order to improve strength and balance in order to decrease fall risk and improve function at home. Baseline: 11/21/22: Baseline HEP initiated.    01/09/23: Pt is able to complete HEP on most days, held on days in which she had notable spasm or wasn't feeling well  Goal status: ACHIEVED       LONG TERM GOALS: Target date: 02/02/2023   Pt will increase FOTO for balance to at least 44 to demonstrate significant improvement in function at home related to balance  Baseline: 11/21/22: 34.      01/09/23: 60/44 Goal status: ACHIEVED   2.  Pt will improve BERG by at least 3 points in order to demonstrate clinically significant improvement in balance.   Baseline: 11/21/22: To be obtained on visit # 2.       11/23/22: 51/56     01/09/23: 56/56 Goal status: ACHIEVED   3.  Pt will improve ABC by at least 13% in order to demonstrate clinically significant improvement in balance confidence.      Baseline: 11/21/22: To be obtained on visit # 2.    11/23/22: 43.1%     01/09/23: 94.4% Goal status: ACHIEVED   4. Pt will decrease 5TSTS by at least 3 seconds in order to demonstrate clinically significant improvement in LE strength      Baseline: 11/21/22: 21.8 seconds.     01/09/23: 17.31 Goal status: ACHIEVED    5. Pt will improve DGI by at least 3 points in order to demonstrate clinically significant improvement in balance and decreased risk for falls.     Baseline: 11/21/22: To be performed on visit # 2.   11/23/22: 16/24.     01/09/23: 21/24 Goal status: ACHIEVED   6. Pt will increase 6MWT by at least 84m (136ft) in order to demonstrate clinically significant improvement in cardiopulmonary endurance and community-level ambulation   Baseline: 11/21/22: To be performed on visit # 2.   11/23/22: 865 ft     01/09/23: 1155 ft  Goal status: ACHIEVED  7. Pt will increase FOTO for  lumbar spine to at least 43 to demonstrate significant improvement in function at home related to balance  Baseline:  01/16/23: 33 Goal status: INITIAL   8. Patient will have full thoracolumbar AROM without  reproduction of pain as needed for reaching items on ground, household chores, bending.  Baseline:  01/16/23: Pain and motion loss with flexion, extension, right lateral flexion; stiffness bilat rotation.  Goal status: INITIAL   PLAN: PT FREQUENCY: 2x/week   PT DURATION: 6 weeks   PLANNED INTERVENTIONS: Therapeutic exercises, Therapeutic activity, Neuromuscular re-education, Patient/Family education, Electrical stimulation, Dry Needling, Cryotherapy, and Manual therapy   PLAN FOR NEXT SESSION: Continue with manual therapy and exercise focused on low back with updated HEP for lower quarter impairments.  Traction at future sessions as needed. Re-assess response to extension in lying. Continue with graded movement as tolerated.   Valentina Gu, PT, DPT [id number]  Eilleen Kempf, PT 01/30/2023, 8:28 AM

## 2023-02-06 ENCOUNTER — Ambulatory Visit: Payer: Medicaid Other | Admitting: Physical Therapy

## 2023-02-07 ENCOUNTER — Ambulatory Visit: Payer: Medicaid Other

## 2023-02-13 ENCOUNTER — Encounter: Payer: Self-pay | Admitting: Physical Therapy

## 2023-02-13 ENCOUNTER — Ambulatory Visit: Payer: Medicaid Other | Attending: Psychiatry | Admitting: Physical Therapy

## 2023-02-13 DIAGNOSIS — M5459 Other low back pain: Secondary | ICD-10-CM | POA: Diagnosis present

## 2023-02-13 DIAGNOSIS — M542 Cervicalgia: Secondary | ICD-10-CM | POA: Diagnosis present

## 2023-02-13 DIAGNOSIS — R293 Abnormal posture: Secondary | ICD-10-CM | POA: Insufficient documentation

## 2023-02-13 DIAGNOSIS — M6281 Muscle weakness (generalized): Secondary | ICD-10-CM | POA: Diagnosis present

## 2023-02-13 NOTE — Therapy (Signed)
OUTPATIENT PHYSICAL THERAPY TREATMENT   Patient Name: Amy Hubbard MRN: [id number] DOB:[birth year], 52 y.o., female Today's Date: 02/13/2023  PCP: Barbaraann Boys, MD  REFERRING PROVIDER: Collene Gobble, MD   END OF SESSION:   PT End of Session - 02/13/23 0823     Visit Number 4    Number of Visits 13    Date for PT Re-Evaluation 02/27/23    Authorization Type re-assess for lumbar spine 01/16/23    Progress Note Due on Visit 10    PT Start Time 0820    PT Stop Time 0901    PT Time Calculation (min) 41 min    Equipment Utilized During Treatment Gait belt    Activity Tolerance Patient tolerated treatment well    Behavior During Therapy WFL for tasks assessed/performed                  Past Medical History:  Diagnosis Date   Anxiety    Complication of anesthesia    PONV (postoperative nausea and vomiting)    Uterine fibroid    Past Surgical History:  Procedure Laterality Date   ABDOMINAL HYSTERECTOMY     BREAST BIOPSY Right 03/17/2017   fibroadenoma, bx/clip   CESAREAN SECTION N/A 10/29/2005   IUD REMOVAL     LAPAROSCOPIC VAGINAL HYSTERECTOMY WITH SALPINGECTOMY Bilateral 05/31/2019   Procedure: LAPAROSCOPIC ASSISTED VAGINAL HYSTERECTOMY WITH SALPINGECTOMY;  Surgeon: Boykin Nearing, MD;  Location: [facility];  Service: Gynecology;  Laterality: Bilateral;   Patient Active Problem List   Diagnosis Date Noted   Postoperative state 05/31/2019    REFERRING DIAG: Multiple sclerosis (CMS-HCC)  Back spasm  Impaired functional mobility, balance, gait, and endurance  Cervicalgia   THERAPY DIAG:  Other low back pain  Muscle weakness (generalized)  Abnormal posture  Rationale for Evaluation and Treatment Rehabilitation  PERTINENT HISTORY: Patient is a 52 year old female referred for impaired gait and balance with history of MS. Pt has f/u with neurology tomorrow. She feels like her symptoms have been more significant since late this past year, most notably  with flare-up around Thanksgiving. Pt reports some neck pain, nausea. Pt was seeing Myles Gip, DPT before for pelvic health needs and also underwent previous vestibular evaluation with Roxana Hires, DPT, [facility] prior to her MS diagnosis. Pt reports she has dealt with notable fatigue and has been sleeping more. Pt is undergoing OCREVUS infusions to treat MS. Pt has migraine disorder and undergoes Aimovig injection for this. Pt reports dizziness with rapid change in head position. Pt reports visual changes recently (some diplopia and difficulty focusing), memory loss. Pt reports intermittent stumbling into her wall when going down hallway.      Pain: Yes, headache and burning diffusely "all over," varies in location  Numbness/Tingling: Yes, numbness/tingling in her hands subtly  Focal Weakness: Yes, sometimes she has intermittent buckling LLE, intermittent loss of grip in her right hand; weakness associated with back pain  Recent changes in overall health/medication: Yes Prior history of physical therapy for balance:  No  Falls: Has patient fallen in last 6 months? No, Number of falls: N/A   Imaging: Yes    MR Brain 05/06/22 Chronic white matter changes stable since 2021. The appearance is nonspecific and could be seen with chronic demyelinating disease or chronic ischemia. Correlate with neurologic symptoms of multiple sclerosis.   Prior level of function: Independent with basic ADLs Occupational demands:Pt out of work now, applying for disability  Hobbies: Able to walk outside, walking to her  mailbox   Red flags (bowel/bladder changes, saddle paresthesia, personal history of cancer, h/o spinal tumors, h/o compression fx, h/o abdominal aneurysm, abdominal pain, chills/fever, night sweats, nausea, vomiting, unrelenting pain): Positive for night pain, history of positive breat biopsy      Weight Bearing Restrictions: No   Living Environment Lives with: lives with one of her daughters, pt is  close with her neighbors Lives in: House/apartment One step to get in front door; bilateral railings to get up back steps (6-7 steps in back). [place] on her yard Has following equipment at home: Single point cane     Patient Goals: How to manage imbalance issues     SUBJECTIVE FOR LUMBAR SPINE (01/16/23): Pt originally referred for MS and imbalance; she has updated PT referral with [facility] including cervicalgia and back spasm. Patient reports her fatigue has been better.She had to stop Oxcarbazepine due to allergic reaction. She feels that her back has improved modestly. Pt reports using topical menthol treatment on her back Saturday due to notable pain. Pt has nerve ablation on 02/06/23 for low back pain. Pt feels her pain alternates from one side to the other. She reports pain in her legs historically, but not over the last week. She states leg that is more affected can alternate side to side. Patient reports disturbed sleep before with back pain - "not in a couple of weeks." No bowel/bladder changes. She feels her balance is improved substantially and she is ready to focus on other needs. Aggravated by: sitting, prolonged standing (e.g. preparing food). Relieved by: lying down on her side (usually left), Epsom salt bath. She states she did have numbness/tingling in her legs (sometimes in feet, sometimes in fingertips), but not as much now. She reports feeling "stiff/sore" presently.     PRECAUTIONS: Fall risk     SUBJECTIVE:                                                                                                                                                                                      SUBJECTIVE STATEMENT:  Pt reports skin crawling sensation diffusely last night. She reports nausea last night. Pt reports generally doing well over this past week. She had f/u for her R shoulder and was instructed on using Rx anti-inflammatory for bursitis. Pt reports that her R shoulder  has been better as well as her back. She reports lifting item with her R arm one time yesterday and irritating it. Pt denies minimal back pain at arrival to PT. Pt reports more mid-back pain at this time, mid-lower thoracic versus lower lumbar.   PAIN:  Are you having pain? Yes, 3/10 pain at  arrival to PT  Pain location: Mid-back     OBJECTIVE: (objective measures completed at initial evaluation unless otherwise dated, re-assessment for lumbar spine on 01/16/23 included below)  Surveys IMBALANCE FOTO: 34, predicted improvement to 44 ABC: 43.1% Modified Fatigue Impact Scale: 83/84 LUMBAR SPINE  FOTO: 33, predicted improvement to 43      GAIT: Assistive device utilized: None Level of assistance: CGA Comments: Patient tends to deviate to left during forward gait, shortened step length bilaterally, decreased hip extension, L hip circumduction      Posture: Pt rests in kyphotic posture, posterior pelvic tilt     AROM AROM (Normal range in degrees) AROM   Lumbar   Flexion (65) 25%* (pain in back)  Extension (30) 50%* (axial pain in back)  Right lateral flexion (25) 75%*  Left lateral flexion (25) 100%  Right rotation (30) 25%*  Left rotation (30) 50% "tight"      Hip Right Left  Flexion (125) 110 110  Extension (15)    Abduction (40) WNL WNL  Adduction     Internal Rotation (45) WNL WNL  External Rotation (45) WNL WNL       Knee    Flexion (135) WNL WNL  Extension (0) WNL WNL      (* = pain; Blank rows = not tested)    LE MMT:   MMT (out of 5) Right 11/22/2022 Left 11/22/2022 Right 01/16/23 Left 01/16/23  Hip flexion 4- 4- 4-* 4-  Hip extension        Hip abduction 4 4 5  (seated) 5 (seated)   Hip adduction 5 5    Hip internal rotation        Hip external rotation        Knee flexion 5 5 4+ 5  Knee extension 5 4+ 5 5  Ankle dorsiflexion 4+ 4+ 5 5  Ankle plantarflexion        Ankle inversion        Ankle eversion        (* = pain; Blank rows = not tested)     Heel walking and toe walking WNL    Muscle Length Hamstrings: R: Positive L: Positive Ely (quadriceps): R: Negative L: Negative Thomas (hip flexors): R: Not done L: Not done      Palpation Location Right Left         Lumbar paraspinals 2 1  Quadratus Lumborum 0 0  Gluteus Maximus 1 0  Gluteus Medius 1 0  Deep hip external rotators 0 0  PSIS 1 1  Fortin's Area (SIJ)    Greater Trochanter    (Blank rows = not tested) Graded on 0-4 scale (0 = no pain, 1 = pain, 2 = pain with wincing/grimacing/flinching, 3 = pain with withdrawal, 4 = unwilling to allow palpation)  Passive Accessory Intervertebral Motion Reproduction of back pain with CPA L3-S1 with pain prior to restriction, empty end-feel  Special Tests Lumbar Radiculopathy and Discogenic: Centralization and Peripheralization (SN 92, -LR 0.12): Modest abatement of symptoms with prone on elbows, no centralization today Slump (SN 83, -LR 0.32): R: Positive L: Positive SLR (SN 92, -LR 0.29): R: Positive L:  Negative   Facet Joint: Extension-Rotation (SN 100, -LR 0.0): R: Positive L: Negative  Lumbar Foraminal Stenosis: Lumbar quadrant (SN 70): R: Negative L: Negative  Hip: FABER (SN 81): R: Positive L: Negative FADIR (SN 94): R: Not done L: Not done Hip scour (SN 50): R: Negative L: Negative  Piriformis Syndrome: PACE  Sign: Negative   Sensation Impaired light touch medial malleolus/digits in L>RLE to light touch bilateral LEs. Proprioception, and hot/cold testing deferred on this date.     Reflexes Deferred     Cranial Nerves Visual acuity and visual fields are intact  -convergence insufficiency  Extraocular muscles are intact  Facial sensation is intact bilaterally  Facial strength is intact bilaterally  Hearing is normal as tested by gross conversation Palate elevates midline, normal phonation  Shoulder shrug strength is intact  Tongue protrudes deviates to R side       Coordination/Cerebellar Finger to Nose: Intermittent hypometria  Heel to Shin: WNL Rapid alternating movements: WNL Finger Opposition: WNL Pronator Drift: Negative        FUNCTIONAL OUTCOME MEASURES INITIAL EVAL   Results Comments  BERG 11/23/22: 51/56 Fall risk, in need of intervention  DGI 11/23/22: 16/24    TUG 11/23/22: 9.9 seconds    5TSTS 11/23/22: 21.8 seconds    6 Minute Walk Test 11/23/22: 735 ft    (Blank rows = not tested)         TODAY'S TREATMENT     02/13/2023     Manual Therapy - for symptom modulation, soft tissue sensitivity and mobility, joint mobility, ROM  Manual lumbar traction with patient in hooklying; 10 sec on, 5 sec off; for nerve root decompression and symptom modulation  - x 5 minutes minutes with intermittent feedback on symptoms in back versus LE  STM and IASTM with Hypervolt T7-L2 paraspinals bilateral;  x 10 minutes    Therapeutic Exercise - sustained positions and repeated movements to modulate symptoms and improve ROM, thoracolumbar ROM and mobility, posterior chain soft tissue extensibility    OBJECTIVE FINDINGS  AROM Lumbar flexion 75% (pull glutes) Lumbar extension 50% (minimal symptoms) Lateral flexion: R 100% , L 100% Thoracolumbar rotation: R 50% , L 25%*   Open book sidelying; 1x10 on either side, 3 sec hold Lower trunk rotations; x10 alternating R/L, 1 sec hold on either side Scapular retractions; 2x10, 5 sec hold, seated with upright posture    PATIENT EDUCATION: Discussed continued HEP and likelihood of transitioning program for low back/back pain to HEP if patient is not having significant pain over the next couple of weeks. We updated and reviewed her home program.     *not today* Piriformis stretch; reviewed alternative technique in sitting to improve compliance Hooklying sciatic nerve floss; reviewed Prone on elbows; 1 x 2 minutes  Mild pressure along low back, no significant pain when assuming prone lying  afterward Anterior pelvic tilt, supine; 2x10  Cold pack (unbilled) - for anti-inflammatory and analgesic effect as needed for reduced pain and improved ability to participate in active PT intervention, along low back for 5 minutes in prone lying, x 5 minutes    PATIENT EDUCATION:  Education details: see above for patient education details Person educated: Patient Education method: Explanation Education comprehension: verbalized understanding     HOME EXERCISE PROGRAM: IMBALANCE Access Code: [email or web address] URL: [email or web address] Date: 12/26/2022 Prepared by: Valentina Gu  Exercises - Sit to Stand Without Arm Support  - 2 x daily - 7 x weekly - 2 sets - 10 reps - Standing 3-Way Kick  - 2 x daily - 7 x weekly - 2 sets - 10 reps - Forward Step Up with Unilateral Counter Support  - 2 x daily - 7 x weekly - 2 sets - 10 reps - Standing Single Leg Stance with Counter Support  - 1 x daily - 7  x weekly - 3 sets - 10-20 sec hold - Side Stepping with Resistance at Thighs and Counter Support  - 1 x daily - 4 x weekly - 1 sets - 4-6 reps - Forward Lunge with Counter Support  - 1 x daily - 4 x weekly - 2 sets - 10 reps  LUMBAR SPINE Access Code: [email or web address] URL: [email or web address] Date: 01/23/2023 Prepared by: Valentina Gu  Exercises - Static Prone on Elbows  - 4 x daily - 7 x weekly - 1 sets - 3-46min hold - Supine Piriformis Stretch with Foot on Ground  - 2 x daily - 7 x weekly - 3 sets - 30sec hold - Supine 90/90 Sciatic Nerve Glide with Knee Flexion/Extension  - 2 x daily - 7 x weekly - 2 sets - 10 reps - 1sec hold - Lower Trunk Rotations  - 2 x daily - 7 x weekly - 2 sets - 10 reps - 1-2sec hold      ASSESSMENT:   CLINICAL IMPRESSION: Patient fortunately has improved R shoulder pain with medical management following f/u appointment with physician. She reports minimal back pain over last 2 sessions, though she has experienced diffuse paresthesias and  burning associated with MS/neuralgias. Pt has mild discomfort affecting mid to lower thoracic region recently - she has fair response with manual therapy today. We updated HEP to include open book and periscapular isometrics. Pt may be able to transition to HEP for back pain given that her condition is well managed at this time; pt has elected to hold on undergoing injections for lumbar spine. Patient has remaining deficits in gross thoracolumbar AROM loss in all directions, mild posterior chain tightness and decreased dural mobility for sciatic n., and low back pain with intermittent LE referral. Patient will benefit from continued skilled therapeutic intervention to address the above deficits as needed for improved function and QoL.    REHAB POTENTIAL: Good   CLINICAL DECISION MAKING: Evolving/moderate complexity   EVALUATION COMPLEXITY: Moderate     GOALS: Goals reviewed with patient? No   SHORT TERM GOALS: Target date: 12/13/2022   Pt will be independent with HEP in order to improve strength and balance in order to decrease fall risk and improve function at home. Baseline: 11/21/22: Baseline HEP initiated.    01/09/23: Pt is able to complete HEP on most days, held on days in which she had notable spasm or wasn't feeling well  Goal status: ACHIEVED       LONG TERM GOALS: Target date: 02/02/2023   Pt will increase FOTO for balance to at least 44 to demonstrate significant improvement in function at home related to balance  Baseline: 11/21/22: 34.      01/09/23: 60/44 Goal status: ACHIEVED   2.  Pt will improve BERG by at least 3 points in order to demonstrate clinically significant improvement in balance.   Baseline: 11/21/22: To be obtained on visit # 2.       11/23/22: 51/56     01/09/23: 56/56 Goal status: ACHIEVED   3.  Pt will improve ABC by at least 13% in order to demonstrate clinically significant improvement in balance confidence.      Baseline: 11/21/22: To be obtained on visit # 2.     11/23/22: 43.1%     01/09/23: 94.4% Goal status: ACHIEVED   4. Pt will decrease 5TSTS by at least 3 seconds in order to demonstrate clinically significant improvement in LE strength      Baseline: 11/21/22: 21.8 seconds.  01/09/23: 17.31 Goal status: ACHIEVED    5. Pt will improve DGI by at least 3 points in order to demonstrate clinically significant improvement in balance and decreased risk for falls.     Baseline: 11/21/22: To be performed on visit # 2.   11/23/22: 16/24.     01/09/23: 21/24 Goal status: ACHIEVED   6. Pt will increase 6MWT by at least 36m (168ft) in order to demonstrate clinically significant improvement in cardiopulmonary endurance and community-level ambulation   Baseline: 11/21/22: To be performed on visit # 2.   11/23/22: [id number] ft     01/09/23: 1155 ft  Goal status: ACHIEVED  7. Pt will increase FOTO for lumbar spine to at least 43 to demonstrate significant improvement in function at home related to balance  Baseline:  01/16/23: 33 Goal status: INITIAL   8. Patient will have full thoracolumbar AROM without reproduction of pain as needed for reaching items on ground, household chores, bending.  Baseline:  01/16/23: Pain and motion loss with flexion, extension, right lateral flexion; stiffness bilat rotation.  Goal status: INITIAL   PLAN: PT FREQUENCY: 2x/week   PT DURATION: 6 weeks   PLANNED INTERVENTIONS: Therapeutic exercises, Therapeutic activity, Neuromuscular re-education, Patient/Family education, Electrical stimulation, Dry Needling, Cryotherapy, and Manual therapy   PLAN FOR NEXT SESSION: Continue with manual therapy and exercise focused on low back with updated HEP for lower quarter impairments.  Traction at future sessions as needed. Continue with graded movement as tolerated.   Valentina Gu, PT, DPT [id number]  Eilleen Kempf, PT 02/13/2023, 8:23 AM

## 2023-02-20 ENCOUNTER — Ambulatory Visit: Payer: Medicaid Other | Admitting: Physical Therapy

## 2023-02-20 ENCOUNTER — Encounter: Payer: Self-pay | Admitting: Physical Therapy

## 2023-02-20 DIAGNOSIS — M6281 Muscle weakness (generalized): Secondary | ICD-10-CM

## 2023-02-20 DIAGNOSIS — M5459 Other low back pain: Secondary | ICD-10-CM | POA: Diagnosis not present

## 2023-02-20 NOTE — Therapy (Signed)
OUTPATIENT PHYSICAL THERAPY TREATMENT/GOAL UPDATE   Patient Name: Amy Hubbard MRN: [id number] DOB:1971/11/13, 52 y.o., female Today's Date: 02/20/2023  PCP: Orene Desanctis, MD  REFERRING PROVIDER: Leonia Reader, MD   END OF SESSION:   PT End of Session - 02/20/23 0821     Visit Number 5    Number of Visits 13    Date for PT Re-Evaluation 03/16/23    Authorization Type re-assess for lumbar spine 01/16/23    Progress Note Due on Visit 10    PT Start Time 0815    PT Stop Time 0858    PT Time Calculation (min) 43 min    Equipment Utilized During Treatment Gait belt    Activity Tolerance Patient tolerated treatment well    Behavior During Therapy WFL for tasks assessed/performed              Past Medical History:  Diagnosis Date   Anxiety    Complication of anesthesia    PONV (postoperative nausea and vomiting)    Uterine fibroid    Past Surgical History:  Procedure Laterality Date   ABDOMINAL HYSTERECTOMY     BREAST BIOPSY Right 03/17/2017   fibroadenoma, bx/clip   CESAREAN SECTION N/A 10/29/2005   IUD REMOVAL     LAPAROSCOPIC VAGINAL HYSTERECTOMY WITH SALPINGECTOMY Bilateral 05/31/2019   Procedure: LAPAROSCOPIC ASSISTED VAGINAL HYSTERECTOMY WITH SALPINGECTOMY;  Surgeon: Suzy Bouchard, MD;  Location: [facility] OR[facility];  Service: Gynecology;  Laterality: Bilateral;   Patient Active Problem List   Diagnosis Date Noted   Postoperative state 05/31/2019    REFERRING DIAG: Multiple sclerosis (CMS-HCC)  Back spasm  Impaired functional mobility, balance, gait, and endurance  Cervicalgia   THERAPY DIAG:  Other low back pain  Muscle weakness (generalized)  Rationale for Evaluation and Treatment Rehabilitation  PERTINENT HISTORY: Patient is a 52 year old female referred for impaired gait and balance with history of MS. Pt has f/u with neurology tomorrow. She feels like her symptoms have been more significant since late this past year, most notably with flare-up  around Thanksgiving. Pt reports some neck pain, nausea. Pt was seeing Sheria Lang, DPT before for pelvic health needs and also underwent previous vestibular evaluation with Ria Comment, DPT, [facility] prior to her MS diagnosis. Pt reports she has dealt with notable fatigue and has been sleeping more. Pt is undergoing OCREVUS infusions to treat MS. Pt has migraine disorder and undergoes Aimovig injection for this. Pt reports dizziness with rapid change in head position. Pt reports visual changes recently (some diplopia and difficulty focusing), memory loss. Pt reports intermittent stumbling into her wall when going down hallway.      Pain: Yes, headache and burning diffusely "all over," varies in location  Numbness/Tingling: Yes, numbness/tingling in her hands subtly  Focal Weakness: Yes, sometimes she has intermittent buckling LLE, intermittent loss of grip in her right hand; weakness associated with back pain  Recent changes in overall health/medication: Yes Prior history of physical therapy for balance:  No  Falls: Has patient fallen in last 6 months? No, Number of falls: N/A   Imaging: Yes    MR Brain 05/06/22 Chronic white matter changes stable since 2021. The appearance is nonspecific and could be seen with chronic demyelinating disease or chronic ischemia. Correlate with neurologic symptoms of multiple sclerosis.   Prior level of function: Independent with basic ADLs Occupational demands:Pt out of work now, applying for disability  Hobbies: Able to walk outside, walking to her mailbox   Red flags (bowel/bladder  changes, saddle paresthesia, personal history of cancer, h/o spinal tumors, h/o compression fx, h/o abdominal aneurysm, abdominal pain, chills/fever, night sweats, nausea, vomiting, unrelenting pain): Positive for night pain, history of positive breat biopsy      Weight Bearing Restrictions: No   Living Environment Lives with: lives with one of her daughters, pt is close with  her neighbors Lives in: House/apartment One step to get in front door; bilateral railings to get up back steps (6-7 steps in back). [place] on her yard Has following equipment at home: Single point cane     Patient Goals: How to manage imbalance issues     SUBJECTIVE FOR LUMBAR SPINE (01/16/23): Pt originally referred for MS and imbalance; she has updated PT referral with [facility] including cervicalgia and back spasm. Patient reports her fatigue has been better.She had to stop Oxcarbazepine due to allergic reaction. She feels that her back has improved modestly. Pt reports using topical menthol treatment on her back Saturday due to notable pain. Pt has nerve ablation on 02/06/23 for low back pain. Pt feels her pain alternates from one side to the other. She reports pain in her legs historically, but not over the last week. She states leg that is more affected can alternate side to side. Patient reports disturbed sleep before with back pain - "not in a couple of weeks." No bowel/bladder changes. She feels her balance is improved substantially and she is ready to focus on other needs. Aggravated by: sitting, prolonged standing (e.g. preparing food). Relieved by: lying down on her side (usually left), Epsom salt bath. She states she did have numbness/tingling in her legs (sometimes in feet, sometimes in fingertips), but not as much now. She reports feeling "stiff/sore" presently.     PRECAUTIONS: Fall risk     SUBJECTIVE:                                                                                                                                                                                      SUBJECTIVE STATEMENT:  Pt reports notable fatigue toward end of last week. She reports hypersomnia yesterday with significant fatigue. 6-7/10 fatigue at arrival. Patient reports primary complaint of pain affecting R upper quarter/R paracervical region. Patient reports upper back is not as bad at  this time and low back is minimally painful on R lower lumbar region.   PAIN:  Are you having pain? No back pain, mild symptoms in R iliolumbar region  Pain location: Mid-back     OBJECTIVE: (objective measures completed at initial evaluation unless otherwise dated, re-assessment for lumbar spine on 01/16/23 included below)  Surveys IMBALANCE FOTO: 34, predicted improvement to 44  ABC: 43.1% Modified Fatigue Impact Scale: 83/84 LUMBAR SPINE  FOTO: 33, predicted improvement to 43      GAIT: Assistive device utilized: None Level of assistance: CGA Comments: Patient tends to deviate to left during forward gait, shortened step length bilaterally, decreased hip extension, L hip circumduction      Posture: Pt rests in kyphotic posture, posterior pelvic tilt     AROM AROM (Normal range in degrees) AROM  Initial eval AROM 02/20/23  Lumbar    Flexion (65) 25%* (pain in back) 75% no pain (mild pull)  Extension (30) 50%* (axial pain in back) 75% (mild pain)  Right lateral flexion (25) 75%* 100%  Left lateral flexion (25) 100% 100%  Right rotation (30) 25%* 75%  Left rotation (30) 50% "tight" 75%       Hip Right Left   Flexion (125) 110 110   Extension (15)     Abduction (40) WNL WNL   Adduction      Internal Rotation (45) WNL WNL   External Rotation (45) WNL WNL         Knee     Flexion (135) WNL WNL   Extension (0) WNL WNL        (* = pain; Blank rows = not tested)    LE MMT:   MMT (out of 5) Right 11/22/2022 Left 11/22/2022 Right 01/16/23 Left 01/16/23 Right 02/20/23 Left 02/20/23  Hip flexion 4- 4- 4-* 4- 4+ 4+  Hip extension       4 4+  Hip abduction 4 4 5  (seated) 5 (seated)  5 5  Hip adduction 5 5   5 5   Hip internal rotation          Hip external rotation          Knee flexion 5 5 4+ 5 4+ 5  Knee extension 5 4+ 5 5 5 5   Ankle dorsiflexion 4+ 4+ 5 5 5 5   Ankle plantarflexion          Ankle inversion          Ankle eversion          (* = pain; Blank rows = not  tested)    Heel walking and toe walking WNL    Muscle Length Hamstrings: R: Positive L: Positive Ely (quadriceps): R: Negative L: Negative Thomas (hip flexors): R: Not done L: Not done      Palpation Location Right Left         Lumbar paraspinals 2 1  Quadratus Lumborum 0 0  Gluteus Maximus 1 0  Gluteus Medius 1 0  Deep hip external rotators 0 0  PSIS 1 1  Fortin's Area (SIJ)    Greater Trochanter    (Blank rows = not tested) Graded on 0-4 scale (0 = no pain, 1 = pain, 2 = pain with wincing/grimacing/flinching, 3 = pain with withdrawal, 4 = unwilling to allow palpation)  Passive Accessory Intervertebral Motion Reproduction of back pain with CPA L3-S1 with pain prior to restriction, empty end-feel  Special Tests Lumbar Radiculopathy and Discogenic: Centralization and Peripheralization (SN 92, -LR 0.12): Modest abatement of symptoms with prone on elbows, no centralization today Slump (SN 83, -LR 0.32): R: Positive L: Positive SLR (SN 92, -LR 0.29): R: Positive L:  Negative   Facet Joint: Extension-Rotation (SN 100, -LR 0.0): R: Positive L: Negative  Lumbar Foraminal Stenosis: Lumbar quadrant (SN 70): R: Negative L: Negative  Hip: FABER (SN 81): R: Positive L: Negative FADIR (  SN 94): R: Not done L: Not done Hip scour (SN 50): R: Negative L: Negative  Piriformis Syndrome: PACE Sign: Negative   Sensation Impaired light touch medial malleolus/digits in L>RLE to light touch bilateral LEs. Proprioception, and hot/cold testing deferred on this date.     Reflexes Deferred     Cranial Nerves Visual acuity and visual fields are intact  -convergence insufficiency  Extraocular muscles are intact  Facial sensation is intact bilaterally  Facial strength is intact bilaterally  Hearing is normal as tested by gross conversation Palate elevates midline, normal phonation  Shoulder shrug strength is intact  Tongue protrudes deviates to R side       Coordination/Cerebellar Finger to Nose: Intermittent hypometria  Heel to Shin: WNL Rapid alternating movements: WNL Finger Opposition: WNL Pronator Drift: Negative        FUNCTIONAL OUTCOME MEASURES INITIAL EVAL   Results Comments  BERG 11/23/22: 51/56 Fall risk, in need of intervention  DGI 11/23/22: 16/24    TUG 11/23/22: 9.9 seconds    5TSTS 11/23/22: 21.8 seconds    6 Minute Walk Test 11/23/22: 735 ft    (Blank rows = not tested)         TODAY'S TREATMENT     02/20/2023   Therapeutic Exercise - sustained positions and repeated movements to modulate symptoms and improve ROM, thoracolumbar ROM and mobility, posterior chain soft tissue extensibility  *GOAL UPDATE PERFORMED  Open book sidelying; 1x10 on either side, 3 sec hold Bridge; 2 x 8, 3 sec hold  Tband shoulder extension; Green Tband; 1x10, 5 sec hold   PATIENT EDUCATION: Discussed current progress, POC moving forward for lumbar spine with plan to consider transition to cervicalgia the week after next. HEP update/review.     *not today* Scapular retractions; 2x10, 5 sec hold, seated with upright posture  Lower trunk rotations; x10 alternating R/L, 1 sec hold on either side Piriformis stretch; reviewed alternative technique in sitting to improve compliance Hooklying sciatic nerve floss; reviewed Prone on elbows; 1 x 2 minutes  Mild pressure along low back, no significant pain when assuming prone lying afterward Anterior pelvic tilt, supine; 2x10  Cold pack (unbilled) - for anti-inflammatory and analgesic effect as needed for reduced pain and improved ability to participate in active PT intervention, along low back for 5 minutes in prone lying, x 5 minutes      Manual Therapy - for symptom modulation, soft tissue sensitivity and mobility, joint mobility, ROM   STM and IASTM with Hypervolt T7-L2 R paraspinals;  x 10 minutes   *not today* Manual lumbar traction with patient in hooklying; 10 sec on, 5 sec off;  for nerve root decompression and symptom modulation  - x 5 minutes minutes with intermittent feedback on symptoms in back versus LE    PATIENT EDUCATION:  Education details: see above for patient education details Person educated: Patient Education method: Explanation Education comprehension: verbalized understanding     HOME EXERCISE PROGRAM: IMBALANCE Access Code: [email or web address] URL: [email or web address] Date: 12/26/2022 Prepared by: Consuela MimesJeremy Parsa Rickett  Exercises - Sit to Stand Without Arm Support  - 2 x daily - 7 x weekly - 2 sets - 10 reps - Standing 3-Way Kick  - 2 x daily - 7 x weekly - 2 sets - 10 reps - Forward Step Up with Unilateral Counter Support  - 2 x daily - 7 x weekly - 2 sets - 10 reps - Standing Single Leg Stance with Counter Support  - 1 x daily -  7 x weekly - 3 sets - 10-20 sec hold - Side Stepping with Resistance at Thighs and Counter Support  - 1 x daily - 4 x weekly - 1 sets - 4-6 reps - Forward Lunge with Counter Support  - 1 x daily - 4 x weekly - 2 sets - 10 reps  LUMBAR SPINE Access Code: [email or web address] URL: [email or web address] Date: 02/20/2023 Prepared by: Consuela Mimes  Exercises - Static Prone on Elbows  - 4 x daily - 7 x weekly - 1 sets - 3-19min hold - Supine Piriformis Stretch with Foot on Ground  - 2 x daily - 7 x weekly - 3 sets - 30sec hold - Supine 90/90 Sciatic Nerve Glide with Knee Flexion/Extension  - 2 x daily - 7 x weekly - 2 sets - 10 reps - 1sec hold - Lower Trunk Rotations  - 2 x daily - 7 x weekly - 2 sets - 10 reps - 1-2sec hold - Sidelying Thoracic Rotation with Open Book  - 2 x daily - 7 x weekly - 2 sets - 10 reps - 3sec hold - Supine Bridge  - 1 x daily - 7 x weekly - 2 sets - 8-10 reps - 3sec hold - Shoulder extension with resistance - Neutral  - 1 x daily - 7 x weekly - 2 sets - 10 reps     ASSESSMENT:   CLINICAL IMPRESSION: Patient has made good progress with R-sided low back pain. She has reported  minimal pain over last 3 weeks and has followed up with MD regarding comorbid R shoulder pain that has improved with use of Rx anti-inflammatory medication. Patient currently exhibits significantly improved thoracolumbar AROM without pain and has met her FOTO score for lumbar spine. We will have pt continue with HEP over the following 2 weeks and then consider transition to upper quarter pain at that time and discharge for lumbar spine pending no notable flare-up or regression with her back pain. Patient does have intermittent severe fatigue associated with comorbid MS. Pt continuing to work on home exercises given for MS prior to transitioning focus to lumbar spine. Patient will benefit from continued skilled therapeutic intervention to ensure adequate self-management with one-time follow-up prior to discharge of this case.     REHAB POTENTIAL: Good   CLINICAL DECISION MAKING: Evolving/moderate complexity   EVALUATION COMPLEXITY: Moderate     GOALS: Goals reviewed with patient? No   SHORT TERM GOALS: Target date: 12/13/2022   Pt will be independent with HEP in order to improve strength and balance in order to decrease fall risk and improve function at home. Baseline: 11/21/22: Baseline HEP initiated.    01/09/23: Pt is able to complete HEP on most days, held on days in which she had notable spasm or wasn't feeling well  Goal status: ACHIEVED       LONG TERM GOALS: Target date: 02/02/2023   Pt will increase FOTO for balance to at least 44 to demonstrate significant improvement in function at home related to balance  Baseline: 11/21/22: 34.      01/09/23: 60/44 Goal status: ACHIEVED   2.  Pt will improve BERG by at least 3 points in order to demonstrate clinically significant improvement in balance.   Baseline: 11/21/22: To be obtained on visit # 2.       11/23/22: 51/56     01/09/23: 56/56 Goal status: ACHIEVED   3.  Pt will improve ABC by at least 13% in order to demonstrate clinically  significant improvement in balance confidence.      Baseline: 11/21/22: To be obtained on visit # 2.    11/23/22: 43.1%     01/09/23: 94.4% Goal status: ACHIEVED   4. Pt will decrease 5TSTS by at least 3 seconds in order to demonstrate clinically significant improvement in LE strength      Baseline: 11/21/22: 21.8 seconds.     01/09/23: 17.31 Goal status: ACHIEVED    5. Pt will improve DGI by at least 3 points in order to demonstrate clinically significant improvement in balance and decreased risk for falls.     Baseline: 11/21/22: To be performed on visit # 2.   11/23/22: 16/24.     01/09/23: 21/24 Goal status: ACHIEVED   6. Pt will increase by at least 39m (156ft) in order to demonstrate clinically significant improvement in cardiopulmonary endurance and community-level ambulation   Baseline: 11/21/22: To be performed on visit # 2.   11/23/22: 735 ft     01/09/23: 1155 ft  Goal status: ACHIEVED  7. Pt will increase FOTO for lumbar spine to at least 43 to demonstrate significant improvement in function at home related to balance  Baseline:  01/16/23: 33.   02/20/23: 57/43 Goal status: ACHIEVED    8. Patient will have full thoracolumbar AROM without reproduction of pain as needed for reaching items on ground, household chores, bending.  Baseline:  01/16/23: Pain and motion loss with flexion, extension, right lateral flexion; stiffness bilat rotation.    02/20/23: 75% or greater motion without reproduction of symptoms.  Goal status: IN PROGRESS     PLAN: PT FREQUENCY: 1-2x/week   PT DURATION: 3-4 weeks   PLANNED INTERVENTIONS: Therapeutic exercises, Therapeutic activity, Neuromuscular re-education, Patient/Family education, Electrical stimulation, Dry Needling, Cryotherapy, and Manual therapy   PLAN FOR NEXT SESSION: Pt to continue with HEP over the following week and will follow-up 03/06/23 to consider discharge for low back and transition to focus on cervicalgia   Consuela Mimes, PT, DPT  #[id number]  Gertie Exon, PT 02/20/2023, 12:31 PM

## 2023-02-27 ENCOUNTER — Encounter: Payer: Medicaid Other | Admitting: Physical Therapy

## 2023-03-06 ENCOUNTER — Ambulatory Visit: Payer: Medicaid Other | Admitting: Physical Therapy

## 2023-03-06 DIAGNOSIS — M5459 Other low back pain: Secondary | ICD-10-CM | POA: Diagnosis not present

## 2023-03-06 DIAGNOSIS — M6281 Muscle weakness (generalized): Secondary | ICD-10-CM

## 2023-03-06 DIAGNOSIS — R293 Abnormal posture: Secondary | ICD-10-CM

## 2023-03-06 DIAGNOSIS — M542 Cervicalgia: Secondary | ICD-10-CM

## 2023-03-06 NOTE — Therapy (Deleted)
OUTPATIENT PHYSICAL THERAPY TREATMENT/GOAL UPDATE   Patient Name: Amy Hubbard MRN: [id number] DOB:[birth year], 52 y.o., female Today's Date: 03/06/2023  PCP: Orene Desanctis, MD  REFERRING PROVIDER: Leonia Reader, MD   END OF SESSION:      Past Medical History:  Diagnosis Date   Anxiety    Complication of anesthesia    PONV (postoperative nausea and vomiting)    Uterine fibroid    Past Surgical History:  Procedure Laterality Date   ABDOMINAL HYSTERECTOMY     BREAST BIOPSY Right 03/17/2017   fibroadenoma, bx/clip   CESAREAN SECTION N/A 10/29/2005   IUD REMOVAL     LAPAROSCOPIC VAGINAL HYSTERECTOMY WITH SALPINGECTOMY Bilateral 05/31/2019   Procedure: LAPAROSCOPIC ASSISTED VAGINAL HYSTERECTOMY WITH SALPINGECTOMY;  Surgeon: Suzy Bouchard, MD;  Location: [facility] ORS;  Service: Gynecology;  Laterality: Bilateral;   Patient Active Problem List   Diagnosis Date Noted   Postoperative state 05/31/2019    REFERRING DIAG: Multiple sclerosis (CMS-HCC)  Back spasm  Impaired functional mobility, balance, gait, and endurance  Cervicalgia   THERAPY DIAG:  Other low back pain  Muscle weakness (generalized)  Abnormal posture  Rationale for Evaluation and Treatment Rehabilitation  PERTINENT HISTORY: Patient is a 52 year old female referred for impaired gait and balance with history of MS. Pt has f/u with neurology tomorrow. She feels like her symptoms have been more significant since late this past year, most notably with flare-up around Thanksgiving. Pt reports some neck pain, nausea. Pt was seeing Sheria Lang, DPT before for pelvic health needs and also underwent previous vestibular evaluation with Ria Comment, DPT, [facility] prior to her MS diagnosis. Pt reports she has dealt with notable fatigue and has been sleeping more. Pt is undergoing OCREVUS infusions to treat MS. Pt has migraine disorder and undergoes Aimovig injection for this. Pt reports dizziness with rapid change  in head position. Pt reports visual changes recently (some diplopia and difficulty focusing), memory loss. Pt reports intermittent stumbling into her wall when going down hallway.      Pain: Yes, headache and burning diffusely "all over," varies in location  Numbness/Tingling: Yes, numbness/tingling in her hands subtly  Focal Weakness: Yes, sometimes she has intermittent buckling LLE, intermittent loss of grip in her right hand; weakness associated with back pain  Recent changes in overall health/medication: Yes Prior history of physical therapy for balance:  No  Falls: Has patient fallen in last 6 months? No, Number of falls: N/A   Imaging: Yes    MR Brain 05/06/22 Chronic white matter changes stable since 2021. The appearance is nonspecific and could be seen with chronic demyelinating disease or chronic ischemia. Correlate with neurologic symptoms of multiple sclerosis.   Prior level of function: Independent with basic ADLs Occupational demands:Pt out of work now, applying for disability  Hobbies: Able to walk outside, walking to her mailbox   Red flags (bowel/bladder changes, saddle paresthesia, personal history of cancer, h/o spinal tumors, h/o compression fx, h/o abdominal aneurysm, abdominal pain, chills/fever, night sweats, nausea, vomiting, unrelenting pain): Positive for night pain, history of positive breat biopsy      Weight Bearing Restrictions: No   Living Environment Lives with: lives with one of her daughters, pt is close with her neighbors Lives in: House/apartment One step to get in front door; bilateral railings to get up back steps (6-7 steps in back). [place] on her yard Has following equipment at home: Single point cane     Patient Goals: How to manage imbalance issues  SUBJECTIVE FOR LUMBAR SPINE (01/16/23): Pt originally referred for MS and imbalance; she has updated PT referral with [facility] including cervicalgia and back spasm. Patient reports  her fatigue has been better.She had to stop Oxcarbazepine due to allergic reaction. She feels that her back has improved modestly. Pt reports using topical menthol treatment on her back Saturday due to notable pain. Pt has nerve ablation on 02/06/23 for low back pain. Pt feels her pain alternates from one side to the other. She reports pain in her legs historically, but not over the last week. She states leg that is more affected can alternate side to side. Patient reports disturbed sleep before with back pain - "not in a couple of weeks." No bowel/bladder changes. She feels her balance is improved substantially and she is ready to focus on other needs. Aggravated by: sitting, prolonged standing (e.g. preparing food). Relieved by: lying down on her side (usually left), Epsom salt bath. She states she did have numbness/tingling in her legs (sometimes in feet, sometimes in fingertips), but not as much now. She reports feeling "stiff/sore" presently.     PRECAUTIONS: Fall risk     SUBJECTIVE:                                                                                                                                                                                      SUBJECTIVE STATEMENT:  Pt reports notable fatigue toward end of last week. She reports hypersomnia yesterday with significant fatigue. 6-7/10 fatigue at arrival. Patient reports primary complaint of pain affecting R upper quarter/R paracervical region. Patient reports upper back is not as bad at this time and low back is minimally painful on R lower lumbar region.   PAIN:  Are you having pain? No back pain, mild symptoms in R iliolumbar region  Pain location: Mid-back     OBJECTIVE: (objective measures completed at initial evaluation unless otherwise dated, re-assessment for lumbar spine on 01/16/23 included below)  Surveys IMBALANCE FOTO: 34, predicted improvement to 44 ABC: 43.1% Modified Fatigue Impact Scale: 83/84 LUMBAR  SPINE  FOTO: 33, predicted improvement to 43      GAIT: Assistive device utilized: None Level of assistance: CGA Comments: Patient tends to deviate to left during forward gait, shortened step length bilaterally, decreased hip extension, L hip circumduction      Posture: Pt rests in kyphotic posture, posterior pelvic tilt     AROM AROM (Normal range in degrees) AROM  Initial eval AROM 02/20/23  Lumbar    Flexion (65) 25%* (pain in back) 75% no pain (mild pull)  Extension (30) 50%* (axial pain in back) 75% (mild pain)  Right  lateral flexion (25) 75%* 100%  Left lateral flexion (25) 100% 100%  Right rotation (30) 25%* 75%  Left rotation (30) 50% "tight" 75%       Hip Right Left   Flexion (125) 110 110   Extension (15)     Abduction (40) WNL WNL   Adduction      Internal Rotation (45) WNL WNL   External Rotation (45) WNL WNL         Knee     Flexion (135) WNL WNL   Extension (0) WNL WNL        (* = pain; Blank rows = not tested)    LE MMT:   MMT (out of 5) Right 11/22/2022 Left 11/22/2022 Right 01/16/23 Left 01/16/23 Right 02/20/23 Left 02/20/23  Hip flexion 4- 4- 4-* 4- 4+ 4+  Hip extension       4 4+  Hip abduction 4 4 5  (seated) 5 (seated)  5 5  Hip adduction 5 5   5 5   Hip internal rotation          Hip external rotation          Knee flexion 5 5 4+ 5 4+ 5  Knee extension 5 4+ 5 5 5 5   Ankle dorsiflexion 4+ 4+ 5 5 5 5   Ankle plantarflexion          Ankle inversion          Ankle eversion          (* = pain; Blank rows = not tested)    Heel walking and toe walking WNL    Muscle Length Hamstrings: R: Positive L: Positive Ely (quadriceps): R: Negative L: Negative Thomas (hip flexors): R: Not done L: Not done      Palpation Location Right Left         Lumbar paraspinals 2 1  Quadratus Lumborum 0 0  Gluteus Maximus 1 0  Gluteus Medius 1 0  Deep hip external rotators 0 0  PSIS 1 1  Fortin's Area (SIJ)    Greater Trochanter    (Blank rows = not  tested) Graded on 0-4 scale (0 = no pain, 1 = pain, 2 = pain with wincing/grimacing/flinching, 3 = pain with withdrawal, 4 = unwilling to allow palpation)  Passive Accessory Intervertebral Motion Reproduction of back pain with CPA L3-S1 with pain prior to restriction, empty end-feel  Special Tests Lumbar Radiculopathy and Discogenic: Centralization and Peripheralization (SN 92, -LR 0.12): Modest abatement of symptoms with prone on elbows, no centralization today Slump (SN 83, -LR 0.32): R: Positive L: Positive SLR (SN 92, -LR 0.29): R: Positive L:  Negative   Facet Joint: Extension-Rotation (SN 100, -LR 0.0): R: Positive L: Negative  Lumbar Foraminal Stenosis: Lumbar quadrant (SN 70): R: Negative L: Negative  Hip: FABER (SN 81): R: Positive L: Negative FADIR (SN 94): R: Not done L: Not done Hip scour (SN 50): R: Negative L: Negative  Piriformis Syndrome: PACE Sign: Negative   Sensation Impaired light touch medial malleolus/digits in L>RLE to light touch bilateral LEs. Proprioception, and hot/cold testing deferred on this date.     Reflexes Deferred     Cranial Nerves Visual acuity and visual fields are intact  -convergence insufficiency  Extraocular muscles are intact  Facial sensation is intact bilaterally  Facial strength is intact bilaterally  Hearing is normal as tested by gross conversation Palate elevates midline, normal phonation  Shoulder shrug strength is intact  Tongue protrudes deviates to  R side      Coordination/Cerebellar Finger to Nose: Intermittent hypometria  Heel to Shin: WNL Rapid alternating movements: WNL Finger Opposition: WNL Pronator Drift: Negative        FUNCTIONAL OUTCOME MEASURES INITIAL EVAL   Results Comments  BERG 11/23/22: 51/56 Fall risk, in need of intervention  DGI 11/23/22: 16/24    TUG 11/23/22: 9.9 seconds    5TSTS 11/23/22: 21.8 seconds    6 Minute Walk Test 11/23/22: 735 ft    (Blank rows = not tested)          TODAY'S TREATMENT     03/06/2023   Therapeutic Exercise - sustained positions and repeated movements to modulate symptoms and improve ROM, thoracolumbar ROM and mobility, posterior chain soft tissue extensibility  *GOAL UPDATE PERFORMED  Open book sidelying; 1x10 on either side, 3 sec hold Bridge; 2 x 8, 3 sec hold  Tband shoulder extension; Green Tband; 1x10, 5 sec hold   PATIENT EDUCATION: Discussed current progress, POC moving forward for lumbar spine with plan to consider transition to cervicalgia the week after next. HEP update/review.     *not today* Scapular retractions; 2x10, 5 sec hold, seated with upright posture  Lower trunk rotations; x10 alternating R/L, 1 sec hold on either side Piriformis stretch; reviewed alternative technique in sitting to improve compliance Hooklying sciatic nerve floss; reviewed Prone on elbows; 1 x 2 minutes  Mild pressure along low back, no significant pain when assuming prone lying afterward Anterior pelvic tilt, supine; 2x10  Cold pack (unbilled) - for anti-inflammatory and analgesic effect as needed for reduced pain and improved ability to participate in active PT intervention, along low back for 5 minutes in prone lying, x 5 minutes      Manual Therapy - for symptom modulation, soft tissue sensitivity and mobility, joint mobility, ROM   STM and IASTM with Hypervolt T7-L2 R paraspinals;  x 10 minutes   *not today* Manual lumbar traction with patient in hooklying; 10 sec on, 5 sec off; for nerve root decompression and symptom modulation  - x 5 minutes minutes with intermittent feedback on symptoms in back versus LE    PATIENT EDUCATION:  Education details: see above for patient education details Person educated: Patient Education method: Explanation Education comprehension: verbalized understanding     HOME EXERCISE PROGRAM: IMBALANCE Access Code: [email or web address] URL: [email or web address] Date: 12/26/2022 Prepared  by: Consuela Mimes  Exercises - Sit to Stand Without Arm Support  - 2 x daily - 7 x weekly - 2 sets - 10 reps - Standing 3-Way Kick  - 2 x daily - 7 x weekly - 2 sets - 10 reps - Forward Step Up with Unilateral Counter Support  - 2 x daily - 7 x weekly - 2 sets - 10 reps - Standing Single Leg Stance with Counter Support  - 1 x daily - 7 x weekly - 3 sets - 10-20 sec hold - Side Stepping with Resistance at Thighs and Counter Support  - 1 x daily - 4 x weekly - 1 sets - 4-6 reps - Forward Lunge with Counter Support  - 1 x daily - 4 x weekly - 2 sets - 10 reps  LUMBAR SPINE Access Code: [email or web address] URL: [email or web address] Date: 02/20/2023 Prepared by: Consuela Mimes  Exercises - Static Prone on Elbows  - 4 x daily - 7 x weekly - 1 sets - 3-70min hold - Supine Piriformis Stretch with Foot on Ground  - 2 x daily - 7 x weekly -  3 sets - 30sec hold - Supine 90/90 Sciatic Nerve Glide with Knee Flexion/Extension  - 2 x daily - 7 x weekly - 2 sets - 10 reps - 1sec hold - Lower Trunk Rotations  - 2 x daily - 7 x weekly - 2 sets - 10 reps - 1-2sec hold - Sidelying Thoracic Rotation with Open Book  - 2 x daily - 7 x weekly - 2 sets - 10 reps - 3sec hold - Supine Bridge  - 1 x daily - 7 x weekly - 2 sets - 8-10 reps - 3sec hold - Shoulder extension with resistance - Neutral  - 1 x daily - 7 x weekly - 2 sets - 10 reps     ASSESSMENT:   CLINICAL IMPRESSION: Patient has made good progress with R-sided low back pain. She has reported minimal pain over last 3 weeks and has followed up with MD regarding comorbid R shoulder pain that has improved with use of Rx anti-inflammatory medication. Patient currently exhibits significantly improved thoracolumbar AROM without pain and has met her FOTO score for lumbar spine. We will have pt continue with HEP over the following 2 weeks and then consider transition to upper quarter pain at that time and discharge for lumbar spine pending no notable  flare-up or regression with her back pain. Patient does have intermittent severe fatigue associated with comorbid MS. Pt continuing to work on home exercises given for MS prior to transitioning focus to lumbar spine. Patient will benefit from continued skilled therapeutic intervention to ensure adequate self-management with one-time follow-up prior to discharge of this case.     REHAB POTENTIAL: Good   CLINICAL DECISION MAKING: Evolving/moderate complexity   EVALUATION COMPLEXITY: Moderate     GOALS: Goals reviewed with patient? No   SHORT TERM GOALS: Target date: 12/13/2022   Pt will be independent with HEP in order to improve strength and balance in order to decrease fall risk and improve function at home. Baseline: 11/21/22: Baseline HEP initiated.    01/09/23: Pt is able to complete HEP on most days, held on days in which she had notable spasm or wasn't feeling well  Goal status: ACHIEVED       LONG TERM GOALS: Target date: 02/02/2023   Pt will increase FOTO for balance to at least 44 to demonstrate significant improvement in function at home related to balance  Baseline: 11/21/22: 34.      01/09/23: 60/44 Goal status: ACHIEVED   2.  Pt will improve BERG by at least 3 points in order to demonstrate clinically significant improvement in balance.   Baseline: 11/21/22: To be obtained on visit # 2.       11/23/22: 51/56     01/09/23: 56/56 Goal status: ACHIEVED   3.  Pt will improve ABC by at least 13% in order to demonstrate clinically significant improvement in balance confidence.      Baseline: 11/21/22: To be obtained on visit # 2.    11/23/22: 43.1%     01/09/23: 94.4% Goal status: ACHIEVED   4. Pt will decrease 5TSTS by at least 3 seconds in order to demonstrate clinically significant improvement in LE strength      Baseline: 11/21/22: 21.8 seconds.     01/09/23: 17.31 Goal status: ACHIEVED    5. Pt will improve DGI by at least 3 points in order to demonstrate clinically significant  improvement in balance and decreased risk for falls.     Baseline: 11/21/22: To be performed on  visit # 2.   11/23/22: 16/24.     01/09/23: 21/24 Goal status: ACHIEVED   6. Pt will increase by at least 60m (172ft) in order to demonstrate clinically significant improvement in cardiopulmonary endurance and community-level ambulation   Baseline: 11/21/22: To be performed on visit # 2.   11/23/22: 735 ft     01/09/23: 1155 ft  Goal status: ACHIEVED  7. Pt will increase FOTO for lumbar spine to at least 43 to demonstrate significant improvement in function at home related to balance  Baseline:  01/16/23: 33.   02/20/23: 57/43 Goal status: ACHIEVED    8. Patient will have full thoracolumbar AROM without reproduction of pain as needed for reaching items on ground, household chores, bending.  Baseline:  01/16/23: Pain and motion loss with flexion, extension, right lateral flexion; stiffness bilat rotation.    02/20/23: 75% or greater motion without reproduction of symptoms.  Goal status: IN PROGRESS     PLAN: PT FREQUENCY: 1-2x/week   PT DURATION: 3-4 weeks   PLANNED INTERVENTIONS: Therapeutic exercises, Therapeutic activity, Neuromuscular re-education, Patient/Family education, Electrical stimulation, Dry Needling, Cryotherapy, and Manual therapy   PLAN FOR NEXT SESSION: Pt to continue with HEP over the following week and will follow-up 03/06/23 to consider discharge for low back and transition to focus on cervicalgia   Consuela Mimes, PT, DPT #[id number]  Gertie Exon, PT 03/06/2023, 7:33 AM
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
## 2023-03-11 ENCOUNTER — Encounter: Payer: Self-pay | Admitting: Physical Therapy

## 2023-03-13 ENCOUNTER — Ambulatory Visit: Payer: Medicaid Other | Admitting: Physical Therapy

## 2023-03-13 DIAGNOSIS — M542 Cervicalgia: Secondary | ICD-10-CM

## 2023-03-13 DIAGNOSIS — M5459 Other low back pain: Secondary | ICD-10-CM | POA: Diagnosis not present

## 2023-03-13 DIAGNOSIS — R293 Abnormal posture: Secondary | ICD-10-CM

## 2023-03-13 DIAGNOSIS — M6281 Muscle weakness (generalized): Secondary | ICD-10-CM
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
## 2023-03-14 ENCOUNTER — Ambulatory Visit
Admission: RE | Admit: 2023-03-14 | Discharge: 2023-03-14 | Disposition: A | Discharge status: Home / Self Care | Payer: Medicaid Other | Source: Ambulatory Visit | Attending: Pediatrics | Admitting: Pediatrics

## 2023-03-14 DIAGNOSIS — Z1231 Encounter for screening mammogram for malignant neoplasm of breast: Secondary | ICD-10-CM | POA: Insufficient documentation

## 2023-03-20 ENCOUNTER — Encounter: Payer: Medicaid Other | Admitting: Physical Therapy

## 2023-03-27 ENCOUNTER — Encounter: Payer: Medicaid Other | Admitting: Physical Therapy

## 2023-04-03 ENCOUNTER — Encounter: Payer: Medicaid Other | Admitting: Physical Therapy

## 2023-04-12 ENCOUNTER — Encounter: Payer: Medicaid Other | Admitting: Physical Therapy

## 2023-04-17 ENCOUNTER — Encounter: Payer: Medicaid Other | Admitting: Physical Therapy

## 2023-04-24 ENCOUNTER — Encounter: Payer: Medicaid Other | Admitting: Physical Therapy

## 2024-01-29 ENCOUNTER — Other Ambulatory Visit: Payer: Self-pay | Admitting: Pediatrics

## 2024-01-29 DIAGNOSIS — Z1231 Encounter for screening mammogram for malignant neoplasm of breast: Secondary | ICD-10-CM

## 2024-03-14 ENCOUNTER — Ambulatory Visit

## 2024-03-19 ENCOUNTER — Ambulatory Visit
Admission: RE | Admit: 2024-03-19 | Discharge: 2024-03-19 | Disposition: A | Discharge status: Home / Self Care | Source: Ambulatory Visit | Attending: Pediatrics | Admitting: Pediatrics

## 2024-03-19 DIAGNOSIS — Z1231 Encounter for screening mammogram for malignant neoplasm of breast: Secondary | ICD-10-CM | POA: Diagnosis present

## 2024-06-25 ENCOUNTER — Other Ambulatory Visit: Payer: Self-pay | Admitting: Medical Genetics

## 2024-07-13 ENCOUNTER — Other Ambulatory Visit
Admission: RE | Admit: 2024-07-13 | Discharge: 2024-07-13 | Disposition: A | Discharge status: Home / Self Care | Payer: Self-pay | Source: Ambulatory Visit | Attending: Medical Genetics | Admitting: Medical Genetics

## 2024-07-22 LAB — GENECONNECT MOLECULAR SCREEN: Genetic Analysis Overall Interpretation: NEGATIVE
